# Patient Record
Sex: Male | Born: 1970 | Hispanic: No | Marital: Married | Smoking: Never smoker
Health system: Southern US, Community
[De-identification: ages and names within clinical notes are randomized; demographics above are authoritative.]

## PROBLEM LIST (undated history)

## (undated) DIAGNOSIS — H903 Sensorineural hearing loss, bilateral: Secondary | ICD-10-CM

## (undated) DIAGNOSIS — G4730 Sleep apnea, unspecified: Secondary | ICD-10-CM

## (undated) DIAGNOSIS — E119 Type 2 diabetes mellitus without complications: Secondary | ICD-10-CM

## (undated) DIAGNOSIS — A048 Other specified bacterial intestinal infections: Secondary | ICD-10-CM

## (undated) DIAGNOSIS — E785 Hyperlipidemia, unspecified: Secondary | ICD-10-CM

## (undated) DIAGNOSIS — K760 Fatty (change of) liver, not elsewhere classified: Secondary | ICD-10-CM

## (undated) DIAGNOSIS — I10 Essential (primary) hypertension: Secondary | ICD-10-CM

## (undated) DIAGNOSIS — D126 Benign neoplasm of colon, unspecified: Secondary | ICD-10-CM

## (undated) HISTORY — DX: Sensorineural hearing loss, bilateral: H90.3

## (undated) HISTORY — DX: Type 2 diabetes mellitus without complications: E11.9

## (undated) HISTORY — DX: Sleep apnea, unspecified: G47.30

## (undated) HISTORY — DX: Essential (primary) hypertension: I10

## (undated) HISTORY — DX: Hyperlipidemia, unspecified: E78.5

## (undated) HISTORY — DX: Benign neoplasm of colon, unspecified: D12.6

## (undated) HISTORY — PX: FOOT SURGERY: SHX648

## (undated) HISTORY — DX: Fatty (change of) liver, not elsewhere classified: K76.0

## (undated) HISTORY — DX: Other specified bacterial intestinal infections: A04.8

---

## 1999-04-26 ENCOUNTER — Encounter: Payer: Self-pay | Admitting: Emergency Medicine

## 1999-04-26 ENCOUNTER — Emergency Department (HOSPITAL_COMMUNITY): Admission: EM | Admit: 1999-04-26 | Discharge: 1999-04-26 | Payer: Self-pay | Admitting: Emergency Medicine

## 1999-05-01 ENCOUNTER — Encounter: Payer: Self-pay | Admitting: Orthopedic Surgery

## 1999-05-01 ENCOUNTER — Observation Stay (HOSPITAL_COMMUNITY): Admission: RE | Admit: 1999-05-01 | Discharge: 1999-05-02 | Payer: Self-pay | Admitting: Orthopedic Surgery

## 2012-08-31 ENCOUNTER — Ambulatory Visit (INDEPENDENT_AMBULATORY_CARE_PROVIDER_SITE_OTHER): Payer: No Typology Code available for payment source | Admitting: Internal Medicine

## 2012-08-31 ENCOUNTER — Encounter: Payer: Self-pay | Admitting: Internal Medicine

## 2012-08-31 VITALS — BP 120/80 | HR 79 | Temp 98.5°F | Resp 20 | Ht 67.0 in | Wt 243.0 lb

## 2012-08-31 DIAGNOSIS — Z0000 Encounter for general adult medical examination without abnormal findings: Secondary | ICD-10-CM

## 2012-08-31 DIAGNOSIS — G4733 Obstructive sleep apnea (adult) (pediatric): Secondary | ICD-10-CM

## 2012-08-31 LAB — COMPREHENSIVE METABOLIC PANEL
ALT: 64 U/L — ABNORMAL HIGH (ref 0–53)
AST: 44 U/L — ABNORMAL HIGH (ref 0–37)
Albumin: 4.2 g/dL (ref 3.5–5.2)
Alkaline Phosphatase: 109 U/L (ref 39–117)
BUN: 8 mg/dL (ref 6–23)
CO2: 26 mEq/L (ref 19–32)
Calcium: 9.8 mg/dL (ref 8.4–10.5)
Chloride: 102 mEq/L (ref 96–112)
Creatinine, Ser: 1.2 mg/dL (ref 0.4–1.5)
GFR: 71.22 mL/min (ref >60.00)
Glucose, Bld: 105 mg/dL — ABNORMAL HIGH (ref 70–99)
Potassium: 3.8 mEq/L (ref 3.5–5.1)
Sodium: 139 mEq/L (ref 135–145)
Total Bilirubin: 0.8 mg/dL (ref 0.3–1.2)
Total Protein: 8.5 g/dL — ABNORMAL HIGH (ref 6.0–8.3)

## 2012-08-31 LAB — LIPID PANEL
Cholesterol: 222 mg/dL — ABNORMAL HIGH (ref 0–200)
HDL: 49.1 mg/dL (ref >39.00)
Total CHOL/HDL Ratio: 5
Triglycerides: 162 mg/dL — ABNORMAL HIGH (ref 0.0–149.0)
VLDL: 32.4 mg/dL (ref 0.0–40.0)

## 2012-08-31 NOTE — Progress Notes (Signed)
Subjective:    Patient ID: Eric Adams, male    DOB: Nov 26, 1970, 42 y.o.   MRN: [id number]  HPI  42 year old patient who is seen today to establish with our practice.  His only concern today is loud snoring. He is engaged to be married. He works as a Naval architect but has had no difficulties with daytime sleepiness. No history of accidents.  Patient states his weight was under 45 pounds when he arrived 20 years ago  Past medical history is unremarkable. He did require treatment for a fracture to the right ankle in 2001. Social history. The patient has been a [place] resident for approximately 20 years. He graduated [employer];  family lives in [place];  nonsmoker. Divorced. 35 year old daughter. Engaged to be married. Family history father died at age 93 complications of COPD Mother is 37 in excellent health except for knee osteoarthritis. 2 brothers one deceased unclear causes. 2 sisters are well.      Review of Systems  Constitutional: Negative for fever, chills, activity change, appetite change and fatigue.  HENT: Negative for hearing loss, ear pain, congestion, rhinorrhea, sneezing, mouth sores, trouble swallowing, neck pain, neck stiffness, dental problem, voice change, sinus pressure and tinnitus.   Eyes: Negative for photophobia, pain, redness and visual disturbance.  Respiratory: Negative for apnea, cough, choking, chest tightness, shortness of breath and wheezing.   Cardiovascular: Negative for chest pain, palpitations and leg swelling.  Gastrointestinal: Negative for nausea, vomiting, abdominal pain, diarrhea, constipation, blood in stool, abdominal distention, anal bleeding and rectal pain.  Genitourinary: Negative for dysuria, urgency, frequency, hematuria, flank pain, decreased urine volume, discharge, penile swelling, scrotal swelling, difficulty urinating, genital sores and testicular pain.  Musculoskeletal: Negative for myalgias, back pain, joint swelling, arthralgias and gait  problem.  Skin: Negative for color change, rash and wound.  Neurological: Negative for dizziness, tremors, seizures, syncope, facial asymmetry, speech difficulty, weakness, light-headedness, numbness and headaches.  Hematological: Negative for adenopathy. Does not bruise/bleed easily.  Psychiatric/Behavioral: Negative for suicidal ideas, hallucinations, behavioral problems, confusion, sleep disturbance (Loud snoring), self-injury, dysphoric mood, decreased concentration and agitation. The patient is not nervous/anxious.        Objective:   Physical Exam  Constitutional: He appears well-developed and well-nourished.  Blood pressure 120/80 Weight 243  HENT:  Head: Normocephalic and atraumatic.  Right Ear: External ear normal.  Left Ear: External ear normal.  Nose: Nose normal.  Mouth/Throat: Oropharynx is clear and moist.  Low hanging soft palate with pharyngeal crowding  Eyes: Conjunctivae and EOM are normal. Pupils are equal, round, and reactive to light. No scleral icterus.  Significant pharyngeal crowding  Neck: Normal range of motion. Neck supple. No JVD present. No thyromegaly present.  Cardiovascular: Regular rhythm, normal heart sounds and intact distal pulses.  Exam reveals no gallop and no friction rub.   No murmur heard. Pulmonary/Chest: Effort normal and breath sounds normal. He exhibits no tenderness.  Abdominal: Soft. Bowel sounds are normal. He exhibits no distension and no mass. There is no tenderness.  Genitourinary: Prostate normal and penis normal.  Musculoskeletal: Normal range of motion. He exhibits no edema and no tenderness.  Lymphadenopathy:    He has no cervical adenopathy.  Neurological: He is alert. He has normal reflexes. No cranial nerve deficit. Coordination normal.  Skin: Skin is warm and dry. No rash noted.  Psychiatric: He has a normal mood and affect. His behavior is normal.          Assessment & Plan:   Preventive  health Exogenous  obesity Loud snoring. Rule out OSA. We'll discuss a home sleep study  Exercise weight loss encouraged

## 2012-08-31 NOTE — Patient Instructions (Signed)
It is important that you exercise regularly, at least 20 minutes 3 to 4 times per week.  If you develop chest pain or shortness of breath seek  medical attention.  You need to lose weight.  Consider a lower calorie diet and regular exercise. 

## 2012-09-01 LAB — LDL CHOLESTEROL, DIRECT: Direct LDL: 150.4 mg/dL

## 2012-09-01 LAB — TSH: TSH: 1.88 u[IU]/mL (ref 0.35–5.50)

## 2012-10-30 ENCOUNTER — Ambulatory Visit: Payer: No Typology Code available for payment source | Admitting: Internal Medicine

## 2012-11-23 ENCOUNTER — Encounter: Payer: Self-pay | Admitting: Internal Medicine

## 2012-11-30 ENCOUNTER — Encounter: Payer: Self-pay | Admitting: Internal Medicine

## 2012-11-30 ENCOUNTER — Ambulatory Visit (INDEPENDENT_AMBULATORY_CARE_PROVIDER_SITE_OTHER): Payer: No Typology Code available for payment source | Admitting: Internal Medicine

## 2012-11-30 VITALS — BP 148/96 | HR 84 | Ht 67.0 in | Wt 243.6 lb

## 2012-11-30 DIAGNOSIS — R1012 Left upper quadrant pain: Secondary | ICD-10-CM

## 2012-11-30 MED ORDER — PANTOPRAZOLE SODIUM 40 MG PO TBEC: 40.0000 mg | DELAYED_RELEASE_TABLET | Freq: Every day | ORAL | Status: DC

## 2012-11-30 NOTE — Progress Notes (Signed)
Patient ID: Eric Adams, male   DOB: 1970/04/04, 42 y.o.   MRN: [id number] HPI: [name] is a 42 yo male with little PMH who is seen in consultation at the request of Dr. Amador Cunas to evaluate left upper quadrant abdominal pain.  The patient is here today with his wife.  The patient reports approximately 1 year of left upper quadrant burning discomfort. The patient reports that this seems to come and go, it is not constant in nature, and seems to relate to certain types of foods. He is sure that spicy food such as very hot peppers make it worse. Spicy peppers also cause loose stools, but his loose stools do not always relate to the burning discomfort. He denies nausea or vomiting. He denies heartburn, dysphagia or odynophagia. No weight loss. Good appetite. He notes that working out in the gym seems to lessen the burning discomfort. He denies change in bowel habits other than a rare loose stools which he associates with hot peppers. He denies melena or rectal bleeding. He normally has one to 2 formed stools daily. He denies trauma, skin rash. No fevers or chills. He is originally from [place].  No known history of H. pylori, previous exploratory treatment or family history of H. pylori. He denies NSAID use.    Of note the pain can occur or be noticed in the midaxillary line  History reviewed. No pertinent past medical history.  Past Surgical History  Procedure Laterality Date  . Foot surgery      right    Current Outpatient Prescriptions  Medication Sig Dispense Refill  . acetaminophen (TYLENOL) 325 MG tablet Take 650 mg by mouth every 6 (six) hours as needed for pain.       No current facility-administered medications for this visit.   No Known Allergies  History reviewed. No pertinent family history.  History  Substance Use Topics  . Smoking status: Never Smoker   . Smokeless tobacco: Never Used  . Alcohol Use: Yes     Comment: glass of wine occassionally    ROS: As per history  of present illness, otherwise negative  BP 148/96  Pulse 84  Ht 5\' 7"  (1.702 m)  Wt 243 lb 9.6 oz (110.496 kg)  BMI 38.14 kg/m2 Constitutional: Well-developed and well-nourished. No distress. HEENT: Normocephalic and atraumatic. Oropharynx is clear and moist. No oropharyngeal exudate. Conjunctivae are normal.  No scleral icterus. Neck: Neck supple. Trachea midline. Cardiovascular: Normal rate, regular rhythm and intact distal pulses. No M/R/G.   Pulmonary/chest: Effort normal and breath sounds normal. No wheezing, rales or rhonchi. No chest wall pain with firm pressure over the rib cage. Abdominal: Soft, nontender, nondistended. Bowel sounds active throughout. There are no masses palpable. No hepatosplenomegaly. Extremities: no clubbing, cyanosis, or edema Lymphadenopathy: No cervical adenopathy noted. Neurological: Alert and oriented to person place and time. Skin: Skin is warm and dry. No rashes noted. Psychiatric: Normal mood and affect. Behavior is normal.  RELEVANT LABS AND IMAGING:  CMP     Component Value Date/Time   NA 139 08/31/2012 1410   K 3.8 08/31/2012 1410   CL 102 08/31/2012 1410   CO2 26 08/31/2012 1410   GLUCOSE 105* 08/31/2012 1410   BUN 8 08/31/2012 1410   CREATININE 1.2 08/31/2012 1410   CALCIUM 9.8 08/31/2012 1410   PROT 8.5* 08/31/2012 1410   ALBUMIN 4.2 08/31/2012 1410   AST 44* 08/31/2012 1410   ALT 64* 08/31/2012 1410   ALKPHOS 109 08/31/2012 1410  BILITOT 0.8 08/31/2012 1410    ASSESSMENT/PLAN:  42 yo male with little PMH who is seen in consultation at the request of Dr. Amador Cunas to evaluate left upper quadrant abdominal pain  1.  LUQ/left mid-axillary line pain -- gastroduodenitis with without H. pylori could explain left upper quadrant pain, but I cannot exclude musculoskeletal or chest wall pain at this time. He would be at high risk for H. pylori given his ethnicity and the higher prevalence of H. pylori infection in African nations.  I have recommended an H. pylori  breath test to exclude active infection. If it is positive he will need antibiotic therapy. Also will give him a trial pantoprazole 40 mg once daily. He is advised to take this 30 minutes to one hour before his first meal of the day. He is instructed to not begin PPI therapy until after breath testing. I would like to see him back in 4-6 weeks' time to assess response. If there is no response, we will consider abdominal ultrasound or EGD. We also could give a trial of muscle relaxers to see if this is musculoskeletal in nature.  He is advised to call us should the pain worsen in any way. He voices understanding.

## 2012-11-30 NOTE — Patient Instructions (Addendum)
You have been scheduled for an H. Pylori/Urea Breath Test. Your appointment is on 12/08/2012 at 7:45am. Please go to [facility] and check in at out patient registration. Please plan on being here for around 20-30 minutes.  Please follow the instructions below to prepare for your test:  1. 12/08/2012 (one day prior to your test) avoid high gas foods, high fiber goods, fruits and fruit juices, vegetables, beans, cereals, fiber supplements, onion and bell peppers. You may eat hamburger, chicken, beef, tuna fish and eggs.  2. You should have nothing to eat or drink after 9:00 pm on 12/10/2012.   3. Do not smoke, chew gum or eat hard candy the morning of your test.  4. You may not have the test within 2 weeks after taking any antibiotics or within 4 weeks if confirming eradication of H.Pylori.  5. On today (2 weeks prior to your test), stop any Pepto-Bismol and/or proton pump inhibitors (Prevacid, Zegerid, Nexium, Prilosec, Protonix, Aciphex, Dexilant).  6. You may use over the counter strength Axid, Pepcid, Tagamet and Zantac 2 weeks prior to your tests. However, you should STOP these 24 hours before the test. You may use Tums and Maalox.  After your test Dr. Rhea Belton would like you to start taking pantoprazole 40 mg                                               We are excited to introduce MyChart, a new best-in-class service that provides you online access to important information in your electronic medical record. We want to make it easier for you to view your health information - all in one secure location - when and where you need it. We expect MyChart will enhance the quality of care and service we provide.  When you register for MyChart, you can:    View your test results.    Request appointments and receive appointment reminders via email.    Request medication renewals.    View your medical history, allergies, medications and immunizations.    Communicate with your  physician's office through a password-protected site.    Conveniently print information such as your medication lists.  To find out if MyChart is right for you, please talk to a member of our clinical staff today. We will gladly answer your questions about this free health and wellness tool.  If you are age 42 or older and want a member of your family to have access to your record, you must provide written consent by completing a proxy form available at our office. Please speak to our clinical staff about guidelines regarding accounts for patients younger than age 11.  As you activate your MyChart account and need any technical assistance, please call the MyChart technical support line at [phone]) [phone]-CHART [phone]) or email your question to [email or web address]. If you email your question(s), please include your name, a return phone number and the best time to reach you.  If you have non-urgent health-related questions, you can send a message to our office through MyChart at [email or web address]. If you have a medical emergency, call 911.  Thank you for using MyChart as your new health and wellness resource!   MyChart licensed from [employer],  [id number]. Patents Pending.

## 2012-12-21 ENCOUNTER — Encounter (HOSPITAL_COMMUNITY)
Admission: RE | Disposition: A | Discharge status: Home / Self Care | Payer: Self-pay | Source: Ambulatory Visit | Attending: Internal Medicine

## 2012-12-21 ENCOUNTER — Ambulatory Visit (HOSPITAL_COMMUNITY)
Admission: RE | Admit: 2012-12-21 | Discharge: 2012-12-21 | Disposition: A | Discharge status: Home / Self Care | Payer: No Typology Code available for payment source | Source: Ambulatory Visit | Attending: Internal Medicine | Admitting: Internal Medicine

## 2012-12-21 DIAGNOSIS — A048 Other specified bacterial intestinal infections: Secondary | ICD-10-CM | POA: Insufficient documentation

## 2012-12-21 DIAGNOSIS — R1012 Left upper quadrant pain: Secondary | ICD-10-CM | POA: Insufficient documentation

## 2012-12-21 HISTORY — PX: BREATH TEK H PYLORI: SHX5422

## 2012-12-21 SURGERY — BREATH TEST, FOR HELICOBACTER PYLORI

## 2012-12-22 ENCOUNTER — Encounter (HOSPITAL_COMMUNITY): Payer: Self-pay | Admitting: Internal Medicine

## 2013-01-25 ENCOUNTER — Telehealth: Payer: Self-pay | Admitting: Internal Medicine

## 2013-01-25 DIAGNOSIS — R1012 Left upper quadrant pain: Secondary | ICD-10-CM

## 2013-01-25 NOTE — Telephone Encounter (Signed)
Spoke with Ms. Harris and she states the patient is still having LUQ pain. He would like to have the ultrasound or scan that Dr. Rhea Belton mentioned at OV. Please, advise.

## 2013-01-25 NOTE — Telephone Encounter (Signed)
ABD [place] complete, LUQ pain If negative, then EGD

## 2013-01-26 ENCOUNTER — Other Ambulatory Visit: Payer: Self-pay | Admitting: *Deleted

## 2013-01-26 DIAGNOSIS — R1012 Left upper quadrant pain: Secondary | ICD-10-CM

## 2013-01-26 NOTE — Telephone Encounter (Signed)
Spoke with patient's EC  Eric Adams and she would like to speak with Dr. Rhea Belton. She is asking to discuss possible CT for patient.

## 2013-01-26 NOTE — Telephone Encounter (Signed)
Mr. Allegretto significant other called because she would like a CT scan of the abdomen and pelvis performed rather than ultrasound She is worried that he may have a neuroendocrine tumor.  She admits that she works in a [employer] and hears a lot about NET The patient has had some response to PPI, but she also reports that he has frequent hot and also continues to have left upper quadrant and left back pain I did tell her that abdominal ultrasound often gives good information, but often does not completely visualize the pancreas I also explained that CT involves radiation, where ultrasound is not, and is more expensive With this being understood, we will proceed to CT scan of the abdomen and pelvis with contrast.  Abdominal ultrasound should be canceled

## 2013-01-26 NOTE — Telephone Encounter (Signed)
Scheduled [place] abd at [facility] radiology([place]) on 01/29/13 at 8:30 AM. NPO 6 hours prior. Left a message for Adalynn Harris to call me back.

## 2013-01-26 NOTE — Telephone Encounter (Signed)
Left a message for patient's EC to call me.

## 2013-01-27 ENCOUNTER — Telehealth: Payer: Self-pay | Admitting: Internal Medicine

## 2013-01-27 DIAGNOSIS — R1012 Left upper quadrant pain: Secondary | ICD-10-CM

## 2013-01-27 NOTE — Telephone Encounter (Signed)
Had [place] scheduled, then fiancee wanted a CT so it was scheduled. Steffanie Rainwater came into ofc, got pt on the phone and d/t radiation, he wants the u/s which is scheduled for 02/01/13 at 0900. Pt stated understanding over the phone.

## 2013-01-27 NOTE — Telephone Encounter (Signed)
Informed pt of his CT appt on 02/01/13 at 0900. He stated understanding and will pick up contrast and instructions.

## 2013-01-28 ENCOUNTER — Other Ambulatory Visit: Payer: No Typology Code available for payment source

## 2013-01-28 ENCOUNTER — Other Ambulatory Visit (HOSPITAL_COMMUNITY): Payer: No Typology Code available for payment source

## 2013-01-29 ENCOUNTER — Ambulatory Visit (HOSPITAL_COMMUNITY): Payer: No Typology Code available for payment source

## 2013-02-01 ENCOUNTER — Ambulatory Visit (HOSPITAL_COMMUNITY)
Admission: RE | Admit: 2013-02-01 | Discharge: 2013-02-01 | Disposition: A | Discharge status: Home / Self Care | Payer: No Typology Code available for payment source | Source: Ambulatory Visit | Attending: Internal Medicine | Admitting: Internal Medicine

## 2013-02-01 ENCOUNTER — Other Ambulatory Visit: Payer: No Typology Code available for payment source

## 2013-02-01 DIAGNOSIS — R109 Unspecified abdominal pain: Secondary | ICD-10-CM | POA: Insufficient documentation

## 2013-02-01 DIAGNOSIS — R1012 Left upper quadrant pain: Secondary | ICD-10-CM

## 2013-02-26 ENCOUNTER — Encounter: Payer: Self-pay | Admitting: Internal Medicine

## 2013-03-02 ENCOUNTER — Encounter: Payer: Self-pay | Admitting: Internal Medicine

## 2013-03-02 ENCOUNTER — Ambulatory Visit (INDEPENDENT_AMBULATORY_CARE_PROVIDER_SITE_OTHER): Payer: BC Managed Care – PPO | Admitting: Internal Medicine

## 2013-03-02 VITALS — BP 130/90 | HR 80 | Ht 67.0 in | Wt 237.0 lb

## 2013-03-02 DIAGNOSIS — K760 Fatty (change of) liver, not elsewhere classified: Secondary | ICD-10-CM

## 2013-03-02 DIAGNOSIS — R748 Abnormal levels of other serum enzymes: Secondary | ICD-10-CM

## 2013-03-02 DIAGNOSIS — R1012 Left upper quadrant pain: Secondary | ICD-10-CM

## 2013-03-02 DIAGNOSIS — A048 Other specified bacterial intestinal infections: Secondary | ICD-10-CM

## 2013-03-02 DIAGNOSIS — K7689 Other specified diseases of liver: Secondary | ICD-10-CM

## 2013-03-02 MED ORDER — BIS SUBCIT-METRONID-TETRACYC 140-125-125 MG PO CAPS: 3.0000 | ORAL_CAPSULE | Freq: Three times a day (TID) | ORAL | Status: DC

## 2013-03-02 MED ORDER — PANTOPRAZOLE SODIUM 40 MG PO TBEC: 40.0000 mg | DELAYED_RELEASE_TABLET | Freq: Every day | ORAL | Status: DC

## 2013-03-02 NOTE — Patient Instructions (Signed)
Your physician has requested that you go to the basement for  lab work before leaving today.  We have sent the following medications to your pharmacy for you to pick up at your convenience: Pylera, while taking pylera take protonix twice a day. Then when finished go back to taking protonix once daily.  Follow up with Dr. Hilarie Fredrickson in office in 3 weeks                                               We are excited to introduce MyChart, a new best-in-class service that provides you online access to important information in your electronic medical record. We want to make it easier for you to view your health information - all in one secure location - when and where you need it. We expect MyChart will enhance the quality of care and service we provide.  When you register for MyChart, you can:    View your test results.    Request appointments and receive appointment reminders via email.    Request medication renewals.    View your medical history, allergies, medications and immunizations.    Communicate with your physician's office through a password-protected site.    Conveniently print information such as your medication lists.  To find out if MyChart is right for you, please talk to a member of our clinical staff today. We will gladly answer your questions about this free health and wellness tool.  If you are age 33 or older and want a member of your family to have access to your record, you must provide written consent by completing a proxy form available at our office. Please speak to our clinical staff about guidelines regarding accounts for patients younger than age 92.  As you activate your MyChart account and need any technical assistance, please call the MyChart technical support line at (336) 83-CHART [phone]) or email your question to [email or web address]. If you email your question(s), please include your name, a return phone number and the best time to reach you.  If you  have non-urgent health-related questions, you can send a message to our office through [place] [facility] [email or web address]. If you have a medical emergency, call 911.  Thank you for using MyChart as your new health and wellness resource!   MyChart licensed from [employer],  1999-2010. Patents Pending.

## 2013-03-02 NOTE — Progress Notes (Signed)
Subjective:    Patient ID: Eric Adams, male    DOB: February 18, 1971, 43 y.o.   MRN: [id number]  HPI Eric Adams is a 43 yo male with little past no history who is seen in followup. He was previously seen around October 2014 to evaluate upper and left upper abdominal pain. He is here alone today. He reports that he is taking pantoprazole 40 mg daily he continues to have upper and left upper quadrant abdominal pain. At times it feels like it is in the mid axillary line. No nausea or vomiting. Food, particularly spicy foods tends to make his pain worse. No heartburn. No dysphagia or odynophagia. Appetite has remained good. He has lost 5-6 pounds since his last visit here. He was unaware of this weight loss, but does note lately his wife has changed his diet. He was married in December and has been eating lower-calorie foods. No change in bowel movements including no melena or rectal bleeding.  He did have a H. pylori breath test performed in October. This result was positive, but unfortunately we never received the result and thus he has not been treated to this point.  Review of Systems As per history of present illness, otherwise negative  Current Medications, Allergies, Past Medical History, Past Surgical History, Family History and Social History were reviewed in [employer] record.     Objective:   Physical Exam BP 130/90  Pulse 80  Ht 5\' 7"  (1.702 m)  Wt 237 lb (107.502 kg)  BMI 37.11 kg/m2 Constitutional: Well-developed and well-nourished. No distress. HEENT: Normocephalic and atraumatic. Oropharynx is clear and moist. No oropharyngeal exudate. Conjunctivae are normal.  No scleral icterus. Cardiovascular: Normal rate, regular rhythm and intact distal pulses. No M/R/G Pulmonary/chest: Effort normal and breath sounds normal. No wheezing, rales or rhonchi. Abdominal: Soft, nontender, nondistended. Bowel sounds active throughout.  Extremities: no clubbing, cyanosis,  or edema Neurological: Alert and oriented to person place and time. Skin: Skin is warm and dry. No rashes noted. Psychiatric: Normal mood and affect. Behavior is normal.  ULTRASOUND ABDOMEN COMPLETE -- Dec 2014   COMPARISON:  None.   FINDINGS: Gallbladder:   No gallstones or wall thickening visualized. There is no pericholecystic fluid. No sonographic Murphy sign noted.   Common bile duct:   Diameter: 5 mm. There is no intrahepatic, common hepatic, or common bile duct dilatation.   Liver:   There is diffuse increased echogenicity throughout the liver.   IVC:   No abnormality visualized.   Pancreas:   Visualized portion unremarkable. Most of pancreas is obscured by gas.   Spleen:   Size and appearance within normal limits.   Right Kidney:   Length: 10.6 cm. Echogenicity within normal limits. No mass or hydronephrosis visualized.   Left Kidney:   Length: 10.9 cm. Echogenicity within normal limits. No mass or hydronephrosis visualized.   Abdominal aorta:   No aneurysm visualized.   Other findings:   No demonstrable ascites.   IMPRESSION: Liver shows diffuse increased echogenicity, probably indicative of diffuse hepatic steatosis. While no focal liver lesions are identified, it must be cautioned that the sensitivity of ultrasound for focal liver lesions is diminished significantly given this degree of underlying fatty change.   Most of pancreas obscured by gas.   Study otherwise unremarkable.  CMP     Component Value Date/Time   NA 139 08/31/2012 1410   K 3.8 08/31/2012 1410   CL 102 08/31/2012 1410   CO2 26 08/31/2012  1410   GLUCOSE 105* 08/31/2012 1410   BUN 8 08/31/2012 1410   CREATININE 1.2 08/31/2012 1410   CALCIUM 9.8 08/31/2012 1410   PROT 8.5* 08/31/2012 1410   ALBUMIN 4.2 08/31/2012 1410   AST 44* 08/31/2012 1410   ALT 64* 08/31/2012 1410   ALKPHOS 109 08/31/2012 1410   BILITOT 0.8 08/31/2012 1410       Assessment & Plan:   43 yo male with little past  no history who is seen in followup. He was previously seen around October 2014 to evaluate upper and left upper abdominal pain.  1.  LUQ pain/+H pylori -- his breath test was positive in October indicating active H. pylori infection. I will treat him with Pylera for 10 days. We will increase his pantoprazole to 40 mg twice daily while he is on this antibiotic. I will see him back in 3 weeks' time, and if his pain is not totally resolved after H. pylori treatment, we will pursue upper endoscopy. He understands and is happy with this plan.  2.  fatty liver/elevated liver enzymes -- he did have slight elevation to AST and ALT in July 2014. Abdominal ultrasound indicated fatty liver disease. I am repeating liver enzymes today and also checking viral hepatitis serologies, ferritin and ANA. If his liver enzymes remain elevated and no other cause is found for elevated liver enzymes, I will start him on vitamin E therapy. I discussed the importance of healthy diet and exercise. He has an elevated BMI, though he also has significant muscle mass. We will discuss nonalcoholic steatohepatitis further at followup if his liver enzymes remain elevated.  He is asked to avoid alcohol  followup in 3 weeks

## 2013-03-05 ENCOUNTER — Telehealth: Payer: Self-pay | Admitting: Internal Medicine

## 2013-03-05 NOTE — Telephone Encounter (Signed)
Spoke for several minutes with wife about pt's OV . We discussed labs, dx, etc. Mailed her a copy of the ofc note; pt will f/u on 03/22/13.

## 2013-03-05 NOTE — Telephone Encounter (Signed)
Discussed with labs labs that [facility] states they will draw.

## 2013-03-17 ENCOUNTER — Other Ambulatory Visit (INDEPENDENT_AMBULATORY_CARE_PROVIDER_SITE_OTHER): Payer: BC Managed Care – PPO

## 2013-03-17 DIAGNOSIS — K760 Fatty (change of) liver, not elsewhere classified: Secondary | ICD-10-CM

## 2013-03-17 DIAGNOSIS — K7689 Other specified diseases of liver: Secondary | ICD-10-CM

## 2013-03-17 DIAGNOSIS — A048 Other specified bacterial intestinal infections: Secondary | ICD-10-CM

## 2013-03-17 DIAGNOSIS — R1012 Left upper quadrant pain: Secondary | ICD-10-CM

## 2013-03-17 LAB — COMPREHENSIVE METABOLIC PANEL
ALBUMIN: 4.4 g/dL (ref 3.5–5.2)
ALT: 42 U/L (ref 0–53)
AST: 31 U/L (ref 0–37)
Alkaline Phosphatase: 109 U/L (ref 39–117)
BUN: 11 mg/dL (ref 6–23)
CALCIUM: 9.9 mg/dL (ref 8.4–10.5)
CO2: 29 mEq/L (ref 19–32)
Chloride: 102 mEq/L (ref 96–112)
Creatinine, Ser: 1.1 mg/dL (ref 0.4–1.5)
GFR: 78.61 mL/min (ref >60.00)
Glucose, Bld: 100 mg/dL — ABNORMAL HIGH (ref 70–99)
POTASSIUM: 3.7 meq/L (ref 3.5–5.1)
SODIUM: 137 meq/L (ref 135–145)
Total Bilirubin: 1 mg/dL (ref 0.3–1.2)
Total Protein: 8.9 g/dL — ABNORMAL HIGH (ref 6.0–8.3)

## 2013-03-17 LAB — FERRITIN: Ferritin: 132.9 ng/mL (ref 22.0–322.0)

## 2013-03-17 LAB — CBC
HCT: 44.7 % (ref 39.0–52.0)
Hemoglobin: 15.4 g/dL (ref 13.0–17.0)
MCHC: 34.4 g/dL (ref 30.0–36.0)
MCV: 81.6 fl (ref 78.0–100.0)
Platelets: 304 10*3/uL (ref 150.0–400.0)
RBC: 5.48 Mil/uL (ref 4.22–5.81)
RDW: 14.5 % (ref 11.5–14.6)
WBC: 5.6 10*3/uL (ref 4.5–10.5)

## 2013-03-18 ENCOUNTER — Encounter: Payer: Self-pay | Admitting: Internal Medicine

## 2013-03-18 LAB — HEPATITIS A ANTIBODY, TOTAL: HEP A TOTAL AB: REACTIVE — AB

## 2013-03-18 LAB — HEPATITIS B SURFACE ANTIBODY,QUALITATIVE: HEP B S AB: NEGATIVE

## 2013-03-18 LAB — HEPATITIS B SURFACE ANTIGEN: HEP B S AG: NEGATIVE

## 2013-03-18 LAB — HEPATITIS C ANTIBODY: HCV Ab: NEGATIVE

## 2013-03-18 LAB — ANA: Anti Nuclear Antibody(ANA): NEGATIVE

## 2013-03-22 ENCOUNTER — Ambulatory Visit (INDEPENDENT_AMBULATORY_CARE_PROVIDER_SITE_OTHER): Payer: BC Managed Care – PPO | Admitting: Internal Medicine

## 2013-03-22 ENCOUNTER — Encounter: Payer: Self-pay | Admitting: Internal Medicine

## 2013-03-22 VITALS — BP 128/88 | HR 84 | Ht 67.0 in | Wt 234.8 lb

## 2013-03-22 DIAGNOSIS — A048 Other specified bacterial intestinal infections: Secondary | ICD-10-CM

## 2013-03-22 DIAGNOSIS — K7689 Other specified diseases of liver: Secondary | ICD-10-CM

## 2013-03-22 DIAGNOSIS — K760 Fatty (change of) liver, not elsewhere classified: Secondary | ICD-10-CM

## 2013-03-22 DIAGNOSIS — Z8619 Personal history of other infectious and parasitic diseases: Secondary | ICD-10-CM

## 2013-03-22 DIAGNOSIS — R1012 Left upper quadrant pain: Secondary | ICD-10-CM

## 2013-03-22 DIAGNOSIS — Z23 Encounter for immunization: Secondary | ICD-10-CM

## 2013-03-22 NOTE — Patient Instructions (Signed)
Your physician has requested that you go to the basement for the following lab work before leaving today:  Hepatic function panel in June 2015     H. Pylori test in February 2015  Follow up with Dr. Hilarie Fredrickson in 1 year  Discontinue all medications

## 2013-03-22 NOTE — Progress Notes (Signed)
Subjective:    Patient ID: Eric Adams, male    DOB: Aug 15, 1970, 43 y.o.   MRN: [id number]  HPI Eric Adams is a 43 yo male with history of elevated LFTs, fatty liver, H. pylori who is seen in followup. He was previously seen 3 weeks ago to evaluate persistent left upper quadrant pain. It was discovered at this appointment that he had a positive H. pylori breath test in October, but the report was never received in my office and therefore he was not treated. He has now completed treatment with Pylera. He reports overall he is feeling much better. Significant improvement in left upper quadrant pain and reports it is only very very mild at present. He feels that this may be due to the fact that he has changed jobs and is being more active. He notices the pain much much less when he is active or exercising. He reports he is eating well with no nausea or vomiting. No heartburn. Normal bowel movements without blood in his stool or melena. No itching, jaundice, lower extremity edema.  He is still taking pantoprazole 40 mg daily  Review of Systems As per history of present illness, otherwise negative  Current Medications, Allergies, Past Medical History, Past Surgical History, Family History and Social History were reviewed in [employer] record.     Objective:   Physical Exam BP 128/88  Pulse 84  Ht 5\' 7"  (1.702 m)  Wt 234 lb 12.8 oz (106.505 kg)  BMI 36.77 kg/m2 Constitutional: Well-developed and well-nourished. No distress. HEENT: Normocephalic and atraumatic.  No scleral icterus. Cardiovascular: Normal rate, regular rhythm and intact distal pulses. No M/R/G Pulmonary/chest: Effort normal and breath sounds normal. No wheezing, rales or rhonchi. Abdominal: Soft, nontender, nondistended. Bowel sounds active throughout. There are no masses palpable. No hepatosplenomegaly. Extremities: no clubbing, cyanosis, or edema Neurological: Alert and oriented to person place and  time. Psychiatric: Normal mood and affect. Behavior is normal.  CMP     Component Value Date/Time   NA 137 03/17/2013 1202   K 3.7 03/17/2013 1202   CL 102 03/17/2013 1202   CO2 29 03/17/2013 1202   GLUCOSE 100* 03/17/2013 1202   BUN 11 03/17/2013 1202   CREATININE 1.1 03/17/2013 1202   CALCIUM 9.9 03/17/2013 1202   PROT 8.9* 03/17/2013 1202   ALBUMIN 4.4 03/17/2013 1202   AST 31 03/17/2013 1202   ALT 42 03/17/2013 1202   ALKPHOS 109 03/17/2013 1202   BILITOT 1.0 03/17/2013 1202   Hep B surf Ag/Ab neg Hep C ab neg ANA neg TSH normal  CBC    Component Value Date/Time   WBC 5.6 03/17/2013 1202   RBC 5.48 03/17/2013 1202   HGB 15.4 03/17/2013 1202   HCT 44.7 03/17/2013 1202   PLT 304.0 03/17/2013 1202   MCV 81.6 03/17/2013 1202   MCHC 34.4 03/17/2013 1202   RDW 14.5 03/17/2013 1202   Abd [place] reviewed -- fatty infiltration of the liver    Assessment & Plan:   43 yo male with history of elevated LFTs, fatty liver, H. pylori who is seen in followup.   1.  H pylori -- now status post treatment with Pylera x10 days. I recommended H. pylori stool antigen to be performed in several weeks off of his PPI. This will be done to confirm eradication. He will also stop his pantoprazole at present given the lack of significant heartburn or dyspepsia.  2.  Left upper quadrant pain -- significant  improvement. This may be due to eradicated H. pylori and improved inflammation. However, we discussed that it could be musculoskeletal related. Either way, it has improved, and I asked that he notify me should the pain worsen or return in any way. He voices understanding  3.  Elevated liver enzymes/fatty liver -- ultrasound revealed fatty liver. Extensive lab testing did not show viral, autoimmune, or genetic causes for liver inflammation.  On most recent check this month, his liver enzymes had normalized. This is encouraging. We discussed trying to maintain a healthy weight, daily exercise, and a healthy diet. We will  follow his liver enzymes every 6 months for the next several years. He is immune to hepatitis A, but not immune to hepatitis B and I have recommended that he begin the hepatitis B vaccine series. He will start this today --Repeat hepatic function panel in 6 months, followup in one year, sooner if necessary

## 2013-04-05 ENCOUNTER — Telehealth: Payer: Self-pay | Admitting: *Deleted

## 2013-04-05 NOTE — Telephone Encounter (Signed)
Message copied by Lance Morin on Mon Apr 05, 2013 11:08 AM ------      Message from: Jerene Bears      Created: Fri Mar 19, 2013  5:53 PM       Liver enzymes have normalized, which is good news.       I do think we should keep a close eye on them going forward for at least the next year, then annually      Remains labs normal and no evidence of autoimmune liver disease or iron overload      He is immune to Hep A       And needs to be vaccinated against Hep B.        Repeat Hepatic function panel in 3 months ------

## 2013-04-05 NOTE — Telephone Encounter (Signed)
Informed pt of results and he has had his 1st Hep B injection on 03/22/13 so his 2nd will be due around 04/21/13. Will mail him a note informing him and we will call in in 3 months to repeat labs. Pt stated understanding.

## 2013-04-26 ENCOUNTER — Telehealth: Payer: Self-pay | Admitting: Internal Medicine

## 2013-04-26 NOTE — Telephone Encounter (Signed)
Pt needs sleep a study in order to get new cpap machine. His isn't working. pls advise

## 2013-04-26 NOTE — Telephone Encounter (Signed)
Left message on voicemail to call office.  

## 2013-04-27 ENCOUNTER — Telehealth: Payer: Self-pay | Admitting: Internal Medicine

## 2013-04-27 NOTE — Telephone Encounter (Signed)
That is fine 

## 2013-04-27 NOTE — Telephone Encounter (Signed)
Pt needs the last appt of the day due to work, is it ok to use 3/23 @ 4:15  (it a Same Day)

## 2013-04-27 NOTE — Telephone Encounter (Signed)
I have scheduled injection #2 with his wife for 04/29/13 at 4:00.  He is due for lab work the end of April.

## 2013-04-28 ENCOUNTER — Ambulatory Visit (INDEPENDENT_AMBULATORY_CARE_PROVIDER_SITE_OTHER): Payer: BC Managed Care – PPO | Admitting: Internal Medicine

## 2013-04-28 ENCOUNTER — Encounter: Payer: Self-pay | Admitting: Internal Medicine

## 2013-04-28 DIAGNOSIS — K760 Fatty (change of) liver, not elsewhere classified: Secondary | ICD-10-CM

## 2013-04-28 DIAGNOSIS — K7689 Other specified diseases of liver: Secondary | ICD-10-CM

## 2013-04-28 DIAGNOSIS — Z23 Encounter for immunization: Secondary | ICD-10-CM

## 2013-04-28 NOTE — Progress Notes (Signed)
Patient came today for his 2nd Hepatitis B vaccine. Upon attempting to document Hepatitis B injection, it was discovered that the original order placed should have been entered as a pediatric dose, not adult dose. Therefore, a new pediatric dose order was entered showing the correct NDC # of the vials used for injection. Patient's injection #1 was given 03/22/13, 2nd injection was given today (04/28/13) and 3rd injection should be given around 09/19/13. Genella Mech, CMA gave injection today and I assisted with charting.

## 2013-05-17 ENCOUNTER — Ambulatory Visit: Payer: BC Managed Care – PPO | Admitting: Internal Medicine

## 2013-05-25 ENCOUNTER — Encounter: Payer: Self-pay | Admitting: Internal Medicine

## 2013-05-25 ENCOUNTER — Ambulatory Visit (INDEPENDENT_AMBULATORY_CARE_PROVIDER_SITE_OTHER): Payer: BC Managed Care – PPO | Admitting: Internal Medicine

## 2013-05-25 VITALS — BP 136/80 | HR 86 | Temp 98.4°F | Resp 20 | Ht 67.0 in | Wt 246.0 lb

## 2013-05-25 DIAGNOSIS — G4733 Obstructive sleep apnea (adult) (pediatric): Secondary | ICD-10-CM

## 2013-05-25 NOTE — Patient Instructions (Signed)
Home sleep study as discussed  You need to lose weight.  Consider a lower calorie diet and regular exercise.

## 2013-05-25 NOTE — Progress Notes (Signed)
Pre-visit discussion using our clinic review tool. No additional management support is needed unless otherwise documented below in the visit note.  

## 2013-05-25 NOTE — Progress Notes (Signed)
Subjective:    Patient ID: Eric Adams, male    DOB: 1971/02/01, 43 y.o.   MRN: [id number]  HPI  43 year old patient who is seen today in followup.  He and his wife have concerns about possible OSA.  He was seen for his initial exam in July of last year and a home sleep study was ordered, but not performed. Wife does describe very loud snoring  Past Medical History  Diagnosis Date  . Hepatic steatosis   . H. pylori infection     History   Social History  . Marital Status: Single    Spouse Name: N/A    Number of Children: 1  . Years of Education: N/A   Occupational History  . Truck Geophysicist/field seismologist    Social History Main Topics  . Smoking status: Never Smoker   . Smokeless tobacco: Never Used  . Alcohol Use: Yes     Comment: glass of wine occasionally  . Drug Use: No  . Sexual Activity: Not on file   Other Topics Concern  . Not on file   Social History Narrative  . No narrative on file    Past Surgical History  Procedure Laterality Date  . Foot surgery Right   . Breath tek h pylori N/A 12/21/2012    Procedure: BREATH TEK H PYLORI;  Surgeon: Jerene Bears, MD;  Location: [facility];  Service: Gastroenterology;  Laterality: N/A;    Family History  Problem Relation Age of Onset  . Colon cancer Neg Hx     No Known Allergies  No current outpatient prescriptions on file prior to visit.   No current facility-administered medications on file prior to visit.    BP 136/80  Pulse 86  Temp(Src) 98.4 F (36.9 C) (Oral)  Resp 20  Ht 5\' 7"  (1.702 m)  Wt 246 lb (111.585 kg)  BMI 38.52 kg/m2  SpO2 95%       Review of Systems  Constitutional: Negative for fever, chills, appetite change and fatigue.  HENT: Negative for congestion, dental problem, ear pain, hearing loss, sore throat, tinnitus, trouble swallowing and voice change.   Eyes: Negative for pain, discharge and visual disturbance.  Respiratory: Negative for cough, chest tightness, wheezing and stridor.     Cardiovascular: Negative for chest pain, palpitations and leg swelling.  Gastrointestinal: Negative for nausea, vomiting, abdominal pain, diarrhea, constipation, blood in stool and abdominal distention.  Genitourinary: Negative for urgency, hematuria, flank pain, discharge, difficulty urinating and genital sores.  Musculoskeletal: Negative for arthralgias, back pain, gait problem, joint swelling, myalgias and neck stiffness.  Skin: Negative for rash.  Neurological: Negative for dizziness, syncope, speech difficulty, weakness, numbness and headaches.  Hematological: Negative for adenopathy. Does not bruise/bleed easily.  Psychiatric/Behavioral: Negative for behavioral problems and dysphoric mood. The patient is not nervous/anxious.        Objective:   Physical Exam  Constitutional: He is oriented to person, place, and time. He appears well-developed.  Blood pressure 130/80 Obese  HENT:  Head: Normocephalic.  Right Ear: External ear normal.  Left Ear: External ear normal.   Low hanging soft palate with extreme pharyngeal crowding  Eyes: Conjunctivae and EOM are normal.  Neck: Normal range of motion.  Cardiovascular: Normal rate and normal heart sounds.   Pulmonary/Chest: Breath sounds normal.  Abdominal: Bowel sounds are normal.  Musculoskeletal: Normal range of motion. He exhibits no edema and no tenderness.  Neurological: He is alert and oriented to person, place, and time.  Psychiatric:  He has a normal mood and affect. His behavior is normal.          Assessment & Plan:   OSA, suspect.  We'll reschedule a home sleep study Weight loss encouraged

## 2013-08-05 ENCOUNTER — Telehealth: Payer: Self-pay | Admitting: Internal Medicine

## 2013-08-05 NOTE — Telephone Encounter (Signed)
Pt is still waiting on the referral for the sleep study since march, sytem shows information sent on 05/26/13, however pt has not heard anything.

## 2013-08-06 NOTE — Telephone Encounter (Signed)
Sent to [facility] to schedule pt   They have been trying to contact pt to schedule left several msg for pt with no return phone call

## 2013-08-30 ENCOUNTER — Telehealth: Payer: Self-pay

## 2013-08-30 NOTE — Telephone Encounter (Signed)
Please advise Eric Adams thanks

## 2013-08-31 ENCOUNTER — Telehealth: Payer: Self-pay | Admitting: Internal Medicine

## 2013-08-31 NOTE — Telephone Encounter (Signed)
Order for HST was received in July 2014. Called and attempted to schedule patient, however, pt never returned calls or didn't show up on the date it was scheduled. Pt finally stated that he would call us back. Staff message sent back to [facility] at that time that we have not been able to set patient up. Called and [facility] caller to return my call to schedule this study.   Also called Dr. Burnice Logan office to advise Hilda Blades that she will need to check if pre cert is needed for this procedure. Rhonda J Cobb

## 2013-08-31 NOTE — Telephone Encounter (Signed)
Pt is calling pulmonary to sch sleep study for 2014. Per rhonda needs to be precerted w/ insurance for home sleep study. Ok to message her back when this has been done

## 2013-09-01 NOTE — Telephone Encounter (Signed)
Pt's wife returned call and has arranged to pick up HST device on Thurs. 09/02/13. Staff message sent to [facility] to check on pre cert for this service. Rhonda J Cobb Nothing else needed on this end. Waiting to hear about the status of the pre cert. Rhonda J Cobb

## 2013-09-01 NOTE — Telephone Encounter (Signed)
Done checked with patient insurance - no authorization needed

## 2013-09-02 DIAGNOSIS — G4730 Sleep apnea, unspecified: Secondary | ICD-10-CM

## 2013-09-02 DIAGNOSIS — G4710 Hypersomnia, unspecified: Secondary | ICD-10-CM

## 2013-09-02 DIAGNOSIS — G4733 Obstructive sleep apnea (adult) (pediatric): Secondary | ICD-10-CM | POA: Insufficient documentation

## 2013-09-06 ENCOUNTER — Encounter: Payer: Self-pay | Admitting: Internal Medicine

## 2013-09-06 DIAGNOSIS — G4730 Sleep apnea, unspecified: Secondary | ICD-10-CM

## 2013-09-06 DIAGNOSIS — G4710 Hypersomnia, unspecified: Secondary | ICD-10-CM

## 2013-09-20 ENCOUNTER — Other Ambulatory Visit: Payer: Self-pay | Admitting: Internal Medicine

## 2013-09-20 DIAGNOSIS — G4733 Obstructive sleep apnea (adult) (pediatric): Secondary | ICD-10-CM

## 2013-11-02 ENCOUNTER — Telehealth: Payer: Self-pay

## 2013-11-02 NOTE — Telephone Encounter (Signed)
Left him a message that we missed him at his nurse appointment today for his Hep B #3 injection. Left our number to call us back.

## 2013-11-05 ENCOUNTER — Institutional Professional Consult (permissible substitution): Payer: BC Managed Care – PPO | Admitting: Pulmonary Disease

## 2013-11-08 ENCOUNTER — Ambulatory Visit (INDEPENDENT_AMBULATORY_CARE_PROVIDER_SITE_OTHER): Payer: BC Managed Care – PPO | Admitting: Internal Medicine

## 2013-11-08 ENCOUNTER — Encounter: Payer: Self-pay | Admitting: Pulmonary Disease

## 2013-11-08 ENCOUNTER — Ambulatory Visit (INDEPENDENT_AMBULATORY_CARE_PROVIDER_SITE_OTHER): Payer: BC Managed Care – PPO | Admitting: Pulmonary Disease

## 2013-11-08 ENCOUNTER — Encounter (INDEPENDENT_AMBULATORY_CARE_PROVIDER_SITE_OTHER): Payer: Self-pay

## 2013-11-08 VITALS — BP 134/82 | HR 84 | Temp 98.2°F | Ht 67.0 in | Wt 249.8 lb

## 2013-11-08 DIAGNOSIS — Z23 Encounter for immunization: Secondary | ICD-10-CM

## 2013-11-08 DIAGNOSIS — G4733 Obstructive sleep apnea (adult) (pediatric): Secondary | ICD-10-CM

## 2013-11-08 NOTE — Assessment & Plan Note (Signed)
The patient has moderate obstructive sleep apnea by his recent sleep study, and I have reviewed the various treatment options with him. We have discussed a dental appliance versus CPAP, while working on weight loss at the same time. I've also discussed with him the impact of sleep disordered breathing to cardiovascular health and quality of life. At this point, he would like to try a dental appliance while working on weight loss. We'll also make a referral to nutrition to help with weight loss.

## 2013-11-08 NOTE — Progress Notes (Signed)
Subjective:    Patient ID: Eric Adams, male    DOB: March 04, 1970, 43 y.o.   MRN: [id number]  HPI  The patient is a 43 year old male who I've been asked to see for management of obstructive sleep apnea. He underwent recent home sleep testing which showed moderate OSA, with an AHI of 19 events per hour and oxygen desaturation as low as 68%. The patient apparently had a sleep study over 4 years ago in [place], and tells me that he was started on CPAP as an outpatient but could not tolerate. He currently has been noted to have loud snoring by his bed partner, as well as witnessed apneas. He thinks he only awakens 1 time a night, and feels rested in the mornings upon arising. He works as a Engineer, drilling, and denies any sleepiness issues with driving. However, he will take a nap during the day in his truck 2 times a week. He also will develop sleep pressure at night if watching television his or movies that are not interesting to him. The patient states that his weight is neutral over the last 2 years, and his Epworth score today is only 3.  Sleep Questionnaire What time do you typically go to bed?( Between what hours) 9-11 PM 9-11 PM at 1542 on 11/08/13 by Inge Rise, CMA How long does it take you to fall asleep? couple minutes (depending if tired) couple minutes (depending if tired) at 1542 on 11/08/13 by Inge Rise, CMA How many times during the night do you wake up? 1 1 at 1542 on 11/08/13 by Inge Rise, CMA What time do you get out of bed to start your day? 0530 0530 at 1542 on 11/08/13 by Inge Rise, CMA Do you drive or operate heavy machinery in your occupation? Yes Yes at 1542 on 11/08/13 by Inge Rise, CMA How much has your weight changed (up or down) over the past two years? (In pounds) 5 lb (2.268 kg) 5 lb (2.268 kg) at 1542 on 11/08/13 by Inge Rise, CMA Have you ever had a sleep study before? Yes Yes at 1542 on 11/08/13 by Inge Rise, CMA If yes,  [place] of [place] at 1542 on 11/08/13 by Inge Rise, CMA If yes, date of study? 2011, 2015 2011, 2015 at 1542 on 11/08/13 by Inge Rise, CMA Do you currently use CPAP? No No at 1542 on 11/08/13 by Inge Rise, CMA Do you wear oxygen at any time? No    Review of Systems  Constitutional: Negative for fever and unexpected weight change.  HENT: Negative for congestion, dental problem, ear pain, nosebleeds, postnasal drip, rhinorrhea, sinus pressure, sneezing, sore throat and trouble swallowing.   Eyes: Negative for redness and itching.  Respiratory: Negative for cough, chest tightness, shortness of breath and wheezing.   Cardiovascular: Negative for palpitations and leg swelling.  Gastrointestinal: Negative for nausea and vomiting.  Genitourinary: Negative for dysuria.  Musculoskeletal: Negative for joint swelling.  Skin: Negative for rash.  Neurological: Negative for headaches.  Hematological: Does not bruise/bleed easily.  Psychiatric/Behavioral: Negative for dysphoric mood. The patient is not nervous/anxious.        Objective:   Physical Exam Constitutional:  Overweight male, no acute distress  HENT:  Nares patent without discharge, septal deviation to the left with narrowing.   Oropharynx without exudate, palate and uvula are very thick and elongated.   Eyes:  Perrla, eomi, no scleral  icterus  Neck:  No JVD, no TMG  Cardiovascular:  Normal rate, regular rhythm, no rubs or gallops.  No murmurs        Intact distal pulses  Pulmonary :  Normal breath sounds, no stridor or respiratory distress   No rales, rhonchi, or wheezing  Abdominal:  Soft, nondistended, bowel sounds present.  No tenderness noted.   Musculoskeletal:  No lower extremity edema noted.  Lymph Nodes:  No cervical lymphadenopathy noted  Skin:  No cyanosis noted  Neurologic:  Appears sleepy, but appropriate, moves all 4 extremities without obvious deficit.           Assessment & Plan:

## 2013-11-08 NOTE — Patient Instructions (Signed)
Will make a nutrition referral to the hospital. Work on weight loss Think about cpap vs dental appliance and let me know.

## 2013-12-23 ENCOUNTER — Ambulatory Visit: Payer: BC Managed Care – PPO | Admitting: Dietician

## 2014-08-18 ENCOUNTER — Encounter: Payer: Self-pay | Admitting: Internal Medicine

## 2014-08-18 ENCOUNTER — Ambulatory Visit (INDEPENDENT_AMBULATORY_CARE_PROVIDER_SITE_OTHER): Payer: Self-pay | Admitting: Internal Medicine

## 2014-08-18 VITALS — BP 138/90 | HR 95 | Temp 98.7°F | Resp 20 | Ht 67.0 in | Wt 253.0 lb

## 2014-08-18 DIAGNOSIS — B9681 Helicobacter pylori [H. pylori] as the cause of diseases classified elsewhere: Secondary | ICD-10-CM

## 2014-08-18 DIAGNOSIS — G4733 Obstructive sleep apnea (adult) (pediatric): Secondary | ICD-10-CM | POA: Diagnosis not present

## 2014-08-18 DIAGNOSIS — Z0000 Encounter for general adult medical examination without abnormal findings: Secondary | ICD-10-CM

## 2014-08-18 DIAGNOSIS — K760 Fatty (change of) liver, not elsewhere classified: Secondary | ICD-10-CM

## 2014-08-18 DIAGNOSIS — A048 Other specified bacterial intestinal infections: Secondary | ICD-10-CM

## 2014-08-18 NOTE — Progress Notes (Signed)
Subjective:    Patient ID: Eric Adams, male    DOB: 1970-10-06, 44 y.o.   MRN: [id number]  HPI  44 year old patient who has a prior history of H. pylori infection.  Mild fatty liver and OSA.  He is accompanied by his wife today with complaints of gaseousness and some mild abdominal discomfort. His main complaint is increased flatus.  He is off PPI therapy.  No significant diarrhea or change in his bowel habits, although at times his stools are described as explosive with the gaseousness.  No nausea, vomiting  The patient/wife also had concerns about possible acromegaly, mainly due to rings fitting much tighter. No change in shoe size over the years  Wt Readings from Last 3 Encounters:  08/18/14 253 lb (114.76 kg)  11/08/13 249 lb 12.8 oz (113.309 kg)  05/25/13 246 lb (111.585 kg)    Past Medical History  Diagnosis Date  . Hepatic steatosis   . H. pylori infection     History   Social History  . Marital Status: Single    Spouse Name: N/A  . Number of Children: 1  . Years of Education: N/A   Occupational History  . Truck Geophysicist/field seismologist    Social History Main Topics  . Smoking status: Never Smoker   . Smokeless tobacco: Never Used  . Alcohol Use: Yes     Comment: glass of wine occasionally  . Drug Use: No  . Sexual Activity: Not on file   Other Topics Concern  . Not on file   Social History Narrative    Past Surgical History  Procedure Laterality Date  . Foot surgery Right   . Breath tek h pylori N/A 12/21/2012    Procedure: BREATH TEK H PYLORI;  Surgeon: Jerene Bears, MD;  Location: [facility];  Service: Gastroenterology;  Laterality: N/A;    Family History  Problem Relation Age of Onset  . Colon cancer Neg Hx     No Known Allergies  No current outpatient prescriptions on file prior to visit.   No current facility-administered medications on file prior to visit.    BP 138/90 mmHg  Pulse 95  Temp(Src) 98.7 F (37.1 C) (Oral)  Resp 20  Ht 5\' 7"   (1.702 m)  Wt 253 lb (114.76 kg)  BMI 39.62 kg/m2  SpO2 96%     Review of Systems  Constitutional: Negative for fever, chills, appetite change and fatigue.  HENT: Negative for congestion, dental problem, ear pain, hearing loss, sore throat, tinnitus, trouble swallowing and voice change.   Eyes: Negative for pain, discharge and visual disturbance.  Respiratory: Negative for cough, chest tightness, wheezing and stridor.   Cardiovascular: Negative for chest pain, palpitations and leg swelling.  Gastrointestinal: Positive for abdominal distention. Negative for nausea, vomiting, abdominal pain, diarrhea, constipation and blood in stool.  Genitourinary: Negative for urgency, hematuria, flank pain, discharge, difficulty urinating and genital sores.  Musculoskeletal: Negative for myalgias, back pain, joint swelling, arthralgias, gait problem and neck stiffness.  Skin: Negative for rash.  Neurological: Negative for dizziness, syncope, speech difficulty, weakness, numbness and headaches.  Hematological: Negative for adenopathy. Does not bruise/bleed easily.  Psychiatric/Behavioral: Negative for behavioral problems and dysphoric mood. The patient is not nervous/anxious.        Objective:   Physical Exam  Constitutional: He is oriented to person, place, and time. He appears well-developed.  HENT:  Head: Normocephalic.  Right Ear: External ear normal.  Left Ear: External ear normal.  Eyes: Conjunctivae and  EOM are normal.  Neck: Normal range of motion.  Cardiovascular: Normal rate and normal heart sounds.   Pulmonary/Chest: Breath sounds normal.  Abdominal: Bowel sounds are normal.  Musculoskeletal: Normal range of motion. He exhibits edema. He exhibits no tenderness.  Mild lower extremity edema  Neurological: He is alert and oriented to person, place, and time.  Psychiatric: He has a normal mood and affect. His behavior is normal.          Assessment & Plan:     Gaseousness.   Patient will give a trial of a lactose-free diet for a period of time and will consider a food diary to determine cause and effect. Obesity/OSA.  Weight loss encouraged History of H. pylori infection.  No true reflux symptoms off PPI therapy  We'll report any persistent new or worsening symptoms

## 2014-08-18 NOTE — Patient Instructions (Signed)
Limit your sodium (Salt) intake    It is important that you exercise regularly, at least 20 minutes 3 to 4 times per week.  If you develop chest pain or shortness of breath seek  medical attention.  You need to lose weight.  Consider a lower calorie diet and regular exercise.  Low-Sodium Eating Plan Sodium raises blood pressure and causes water to be held in the body. Getting less sodium from food will help lower your blood pressure, reduce any swelling, and protect your heart, liver, and kidneys. We get sodium by adding salt (sodium chloride) to food. Most of our sodium comes from canned, boxed, and frozen foods. Restaurant foods, fast foods, and pizza are also very high in sodium. Even if you take medicine to lower your blood pressure or to reduce fluid in your body, getting less sodium from your food is important. WHAT IS MY PLAN? Most people should limit their sodium intake to 2,300 mg a day. Your health care provider recommends that you limit your sodium intake to __________ a day.  WHAT DO I NEED TO KNOW ABOUT THIS EATING PLAN? For the low-sodium eating plan, you will follow these general guidelines:  Choose foods with a % Daily Value for sodium of less than 5% (as listed on the food label).   Use salt-free seasonings or herbs instead of table salt or sea salt.   Check with your health care provider or pharmacist before using salt substitutes.   Eat fresh foods.  Eat more vegetables and fruits.  Limit canned vegetables. If you do use them, rinse them well to decrease the sodium.   Limit cheese to 1 oz (28 g) per day.   Eat lower-sodium products, often labeled as "lower sodium" or "no salt added."  Avoid foods that contain monosodium glutamate (MSG). MSG is sometimes added to [place] food and some canned foods.  Check food labels (Nutrition Facts labels) on foods to learn how much sodium is in one serving.  Eat more home-cooked food and less restaurant, buffet, and fast  food.  When eating at a restaurant, ask that your food be prepared with less salt or none, if possible.  HOW DO I READ FOOD LABELS FOR SODIUM INFORMATION? The Nutrition Facts label lists the amount of sodium in one serving of the food. If you eat more than one serving, you must multiply the listed amount of sodium by the number of servings. Food labels may also identify foods as:  Sodium free--Less than 5 mg in a serving.  Very low sodium--35 mg or less in a serving.  Low sodium--140 mg or less in a serving.  Light in sodium--50% less sodium in a serving. For example, if a food that usually has 300 mg of sodium is changed to become light in sodium, it will have 150 mg of sodium.  Reduced sodium--25% less sodium in a serving. For example, if a food that usually has 400 mg of sodium is changed to reduced sodium, it will have 300 mg of sodium. WHAT FOODS CAN I EAT? Grains Low-sodium cereals, including oats, puffed wheat and rice, and shredded wheat cereals. Low-sodium crackers. Unsalted rice and pasta. Lower-sodium bread.  Vegetables Frozen or fresh vegetables. Low-sodium or reduced-sodium canned vegetables. Low-sodium or reduced-sodium tomato sauce and paste. Low-sodium or reduced-sodium tomato and vegetable juices.  Fruits Fresh, frozen, and canned fruit. Fruit juice.  Meat and Other Protein Products Low-sodium canned tuna and salmon. Fresh or frozen meat, poultry, seafood, and fish. Lamb. Unsalted  nuts. Dried beans, peas, and lentils without added salt. Unsalted canned beans. Homemade soups without salt. Eggs.  Dairy Milk. Soy milk. Ricotta cheese. Low-sodium or reduced-sodium cheeses. Yogurt.  Condiments Fresh and dried herbs and spices. Salt-free seasonings. Onion and garlic powders. Low-sodium varieties of mustard and ketchup. Lemon juice.  Fats and Oils Reduced-sodium salad dressings. Unsalted butter.  Other Unsalted popcorn and pretzels.  The items listed above may  not be a complete list of recommended foods or beverages. Contact your dietitian for more options. WHAT FOODS ARE NOT RECOMMENDED? Grains Instant hot cereals. Bread stuffing, pancake, and biscuit mixes. Croutons. Seasoned rice or pasta mixes. Noodle soup cups. Boxed or frozen macaroni and cheese. Self-rising flour. Regular salted crackers. Vegetables Regular canned vegetables. Regular canned tomato sauce and paste. Regular tomato and vegetable juices. Frozen vegetables in sauces. Salted french fries. Olives. Eric Adams. Relishes. Sauerkraut. Salsa. Meat and Other Protein Products Salted, canned, smoked, spiced, or pickled meats, seafood, or fish. Bacon, ham, sausage, hot dogs, corned beef, chipped beef, and packaged luncheon meats. Salt pork. Jerky. Pickled herring. Anchovies, regular canned tuna, and sardines. Salted nuts. Dairy Processed cheese and cheese spreads. Cheese curds. Blue cheese and cottage cheese. Buttermilk.  Condiments Onion and garlic salt, seasoned salt, table salt, and sea salt. Canned and packaged gravies. Worcestershire sauce. Tartar sauce. Barbecue sauce. Teriyaki sauce. Soy sauce, including reduced sodium. Steak sauce. Fish sauce. Oyster sauce. Cocktail sauce. Horseradish. Regular ketchup and mustard. Meat flavorings and tenderizers. Bouillon cubes. Hot sauce. Tabasco sauce. Marinades. Taco seasonings. Relishes. Fats and Oils Regular salad dressings. Salted butter. Margarine. Ghee. Bacon fat.  Other Potato and tortilla chips. Corn chips and puffs. Salted popcorn and pretzels. Canned or dried soups. Pizza. Frozen entrees and pot pies.  The items listed above may not be a complete list of foods and beverages to avoid. Contact your dietitian for more information. Document Released: 08/03/2001 Document Revised: 02/16/2013 Document Reviewed: 12/16/2012 [facility] Patient Information 2015 [place], [place]. This information is not intended to replace advice given to you by your  health care provider. Make sure you discuss any questions you have with your health care provider. Lactose Intolerance, Adult Lactose intolerance is when the body is not able to digest lactose, a sugar found in milk and milk products. Lactose intolerance is caused by your body not producing enough of the enzyme lactase. When there is not enough lactase to digest the amount of lactose consumed, discomfort may be felt. Lactose intolerance is not a milk allergy. For most people, lactase deficiency is a condition that develops naturally over time. After about the age of 2, the body begins to produce less lactase. But many people may not experience symptoms until they are much older. CAUSES Things that can cause you to be lactose intolerant include:  Aging.  Being born without the ability to make lactase.  Certain digestive diseases.  Injuries to the small intestine. SYMPTOMS   Feeling sick to your stomach (nauseous).  Diarrhea.  Cramps.  Bloating.  Gas. Symptoms usually show up a half hour or 2 hours after eating or drinking products containing lactose. TREATMENT  No treatment can improve the body's ability to produce lactase. However, symptoms can be controlled through diet. A medicine may be given to you to take when you consume lactose-containing foods or drinks. The medicine contains the lactase enzyme, which help the body digest lactose better. HOME CARE INSTRUCTIONS  Eat or drink dairy products as told by your caregiver or dietician.  Take all medicine  as directed by your caregiver.  Find lactose-free or lactose-reduced products at your local grocery store.  Talk to your caregiver or dietician to decide if you need any dietary supplements. The following is the amount of calcium needed from the diet:  19 to 50 years: 1000 mg  Over 50 years: 1200 mg Calcium and Lactose in Common Foods Non-Dairy Products / Calcium Content (mg)  Calcium-fortified orange juice, 1 cup / 308  to 344 mg  Sardines, with edible bones, 3 oz / 270 mg  Salmon, canned, with edible bones, 3 oz / 205 mg  Soymilk, fortified, 1 cup / 200 mg  Broccoli (raw), 1 cup / 90 mg  Orange, 1 medium / 50 mg  Pinto beans,  cup / 40 mg  Tuna, canned, 3 oz / 10 mg  Lettuce greens,  cup / 10 mg Dairy Products / Calcium Content (mg) / Lactose Content (g)  Yogurt, plain, low-fat, 1 cup / 415 mg / 5 g  Milk, reduced fat, 1 cup / 295 mg / 11 g  Swiss cheese, 1 oz / 270 mg / 1 g  Ice cream,  cup / 85 mg / 6 g  Cottage cheese,  cup / 75 mg / 2 to 3 g SEEK MEDICAL CARE IF: You have no relief from your symptoms. Document Released: 02/11/2005 Document Revised: 05/06/2011 Document Reviewed: 05/14/2013 [facility] Patient Information 2015 [place], [place]. This information is not intended to replace advice given to you by your health care provider. Make sure you discuss any questions you have with your health care provider.

## 2014-08-18 NOTE — Progress Notes (Signed)
Pre visit review using our clinic review tool, if applicable. No additional management support is needed unless otherwise documented below in the visit note. 

## 2014-08-19 ENCOUNTER — Encounter: Payer: Self-pay | Admitting: Internal Medicine

## 2014-08-19 LAB — COMPREHENSIVE METABOLIC PANEL
ALBUMIN: 4.6 g/dL (ref 3.5–5.2)
ALT: 52 U/L (ref 0–53)
AST: 41 U/L — AB (ref 0–37)
Alkaline Phosphatase: 124 U/L — ABNORMAL HIGH (ref 39–117)
BUN: 11 mg/dL (ref 6–23)
CALCIUM: 10.2 mg/dL (ref 8.4–10.5)
CHLORIDE: 101 meq/L (ref 96–112)
CO2: 29 mEq/L (ref 19–32)
Creatinine, Ser: 1.15 mg/dL (ref 0.40–1.50)
GFR: 73.41 mL/min (ref >60.00)
Glucose, Bld: 169 mg/dL — ABNORMAL HIGH (ref 70–99)
POTASSIUM: 3.9 meq/L (ref 3.5–5.1)
Sodium: 137 mEq/L (ref 135–145)
Total Bilirubin: 0.7 mg/dL (ref 0.2–1.2)
Total Protein: 8.6 g/dL — ABNORMAL HIGH (ref 6.0–8.3)

## 2014-08-19 LAB — CBC WITH DIFFERENTIAL/PLATELET
BASOS PCT: 0.6 % (ref 0.0–3.0)
Basophils Absolute: 0 10*3/uL (ref 0.0–0.1)
Eosinophils Absolute: 0.1 10*3/uL (ref 0.0–0.7)
Eosinophils Relative: 0.9 % (ref 0.0–5.0)
HCT: 45 % (ref 39.0–52.0)
Hemoglobin: 15.4 g/dL (ref 13.0–17.0)
LYMPHS ABS: 2.8 10*3/uL (ref 0.7–4.0)
Lymphocytes Relative: 50.3 % — ABNORMAL HIGH (ref 12.0–46.0)
MCHC: 34.2 g/dL (ref 30.0–36.0)
MCV: 81.4 fl (ref 78.0–100.0)
Monocytes Absolute: 0.5 10*3/uL (ref 0.1–1.0)
Monocytes Relative: 8.8 % (ref 3.0–12.0)
Neutro Abs: 2.2 10*3/uL (ref 1.4–7.7)
Neutrophils Relative %: 39.4 % — ABNORMAL LOW (ref 43.0–77.0)
PLATELETS: 291 10*3/uL (ref 150.0–400.0)
RBC: 5.53 Mil/uL (ref 4.22–5.81)
RDW: 14.6 % (ref 11.5–15.5)
WBC: 5.5 10*3/uL (ref 4.0–10.5)

## 2014-08-19 LAB — TSH: TSH: 1.83 u[IU]/mL (ref 0.35–4.50)

## 2014-08-22 ENCOUNTER — Other Ambulatory Visit (INDEPENDENT_AMBULATORY_CARE_PROVIDER_SITE_OTHER): Payer: 59

## 2014-08-22 ENCOUNTER — Other Ambulatory Visit: Payer: Self-pay | Admitting: *Deleted

## 2014-08-22 DIAGNOSIS — R739 Hyperglycemia, unspecified: Secondary | ICD-10-CM

## 2014-08-22 DIAGNOSIS — E1169 Type 2 diabetes mellitus with other specified complication: Secondary | ICD-10-CM | POA: Insufficient documentation

## 2014-08-22 DIAGNOSIS — E118 Type 2 diabetes mellitus with unspecified complications: Secondary | ICD-10-CM | POA: Insufficient documentation

## 2014-08-22 LAB — HEMOGLOBIN A1C: Hgb A1c MFr Bld: 8.6 % — ABNORMAL HIGH (ref 4.6–6.5)

## 2014-08-22 MED ORDER — METFORMIN HCL 500 MG PO TABS: 500.0000 mg | ORAL_TABLET | Freq: Two times a day (BID) | ORAL | Status: DC

## 2014-08-24 ENCOUNTER — Telehealth: Payer: Self-pay | Admitting: Internal Medicine

## 2014-08-24 DIAGNOSIS — E119 Type 2 diabetes mellitus without complications: Secondary | ICD-10-CM

## 2014-08-24 NOTE — Telephone Encounter (Signed)
Dr. Raliegh Ip, okay to do referral to Endo?

## 2014-08-24 NOTE — Telephone Encounter (Signed)
Patient's wife is requesting a referral to endocrinology.

## 2014-08-25 NOTE — Telephone Encounter (Signed)
ok 

## 2014-08-25 NOTE — Telephone Encounter (Signed)
Left detailed message order for Endocrinologist referral was done and someone will contact you regarding an appointment. Any questions please call office.

## 2014-08-30 ENCOUNTER — Encounter: Payer: Self-pay | Admitting: Internal Medicine

## 2014-08-30 ENCOUNTER — Ambulatory Visit (INDEPENDENT_AMBULATORY_CARE_PROVIDER_SITE_OTHER): Payer: 59 | Admitting: Internal Medicine

## 2014-08-30 ENCOUNTER — Other Ambulatory Visit: Payer: Self-pay | Admitting: *Deleted

## 2014-08-30 VITALS — BP 140/98 | HR 92 | Temp 98.3°F | Resp 20 | Ht 67.0 in | Wt 247.0 lb

## 2014-08-30 DIAGNOSIS — E119 Type 2 diabetes mellitus without complications: Secondary | ICD-10-CM | POA: Diagnosis not present

## 2014-08-30 DIAGNOSIS — E669 Obesity, unspecified: Secondary | ICD-10-CM | POA: Diagnosis not present

## 2014-08-30 LAB — GLUCOSE, POCT (MANUAL RESULT ENTRY): POC GLUCOSE: 110 mg/dL — AB (ref 70–99)

## 2014-08-30 MED ORDER — GLUCOSE BLOOD VI STRP: ORAL_STRIP | Status: DC

## 2014-08-30 MED ORDER — ONETOUCH LANCETS MISC: Status: DC

## 2014-08-30 MED ORDER — EMPAGLIFLOZIN 25 MG PO TABS: 25.0000 mg | ORAL_TABLET | Freq: Every day | ORAL | Status: DC

## 2014-08-30 NOTE — Progress Notes (Signed)
Subjective:    Patient ID: Eric Adams, male    DOB: 1971/01/21, 44 y.o.   MRN: [id number]  HPI  44 year old patient who is seen today for follow-up of new onset diabetes.  The patient has a history of mild impaired glucose tolerance and noted to have a fasting blood sugar of 169.  One week ago.  Hemoglobin A1c 8.6 He has been on metformin therapy 500 mg twice daily and has had some mild diarrhea.  His wife has requested endocrinology referral and this has been set up No hyperglycemic symptoms.  Random blood sugar today 110  BP Readings from Last 3 Encounters:  08/30/14 140/98  08/18/14 138/90  11/08/13 134/82    Wt Readings from Last 3 Encounters:  08/30/14 247 lb (112.038 kg)  08/18/14 253 lb (114.76 kg)  11/08/13 249 lb 12.8 oz (113.309 kg)   Past Medical History  Diagnosis Date  . Hepatic steatosis   . H. pylori infection     History   Social History  . Marital Status: Single    Spouse Name: N/A  . Number of Children: 1  . Years of Education: N/A   Occupational History  . Truck Geophysicist/field seismologist    Social History Main Topics  . Smoking status: Never Smoker   . Smokeless tobacco: Never Used  . Alcohol Use: Yes     Comment: glass of wine occasionally  . Drug Use: No  . Sexual Activity: Not on file   Other Topics Concern  . Not on file   Social History Narrative    Past Surgical History  Procedure Laterality Date  . Foot surgery Right   . Breath tek h pylori N/A 12/21/2012    Procedure: BREATH TEK H PYLORI;  Surgeon: Jerene Bears, MD;  Location: [facility];  Service: Gastroenterology;  Laterality: N/A;    Family History  Problem Relation Age of Onset  . Colon cancer Neg Hx     No Known Allergies  Current Outpatient Prescriptions on File Prior to Visit  Medication Sig Dispense Refill  . metFORMIN (GLUCOPHAGE) 500 MG tablet Take 1 tablet (500 mg total) by mouth 2 (two) times daily with a meal. 180 tablet 1   No current facility-administered medications  on file prior to visit.    BP 140/98 mmHg  Pulse 92  Temp(Src) 98.3 F (36.8 C) (Oral)  Resp 20  Ht 5\' 7"  (1.702 m)  Wt 247 lb (112.038 kg)  BMI 38.68 kg/m2  SpO2 95%       Review of Systems  Constitutional: Negative for fever, chills, appetite change and fatigue.  HENT: Negative for congestion, dental problem, ear pain, hearing loss, sore throat, tinnitus, trouble swallowing and voice change.   Eyes: Negative for pain, discharge and visual disturbance.  Respiratory: Negative for cough, chest tightness, wheezing and stridor.   Cardiovascular: Negative for chest pain, palpitations and leg swelling.  Gastrointestinal: Negative for nausea, vomiting, abdominal pain, diarrhea, constipation, blood in stool and abdominal distention.  Genitourinary: Negative for urgency, hematuria, flank pain, discharge, difficulty urinating and genital sores.  Musculoskeletal: Negative for myalgias, back pain, joint swelling, arthralgias, gait problem and neck stiffness.  Skin: Negative for rash.  Neurological: Negative for dizziness, syncope, speech difficulty, weakness, numbness and headaches.  Hematological: Negative for adenopathy. Does not bruise/bleed easily.  Psychiatric/Behavioral: Negative for behavioral problems and dysphoric mood. The patient is not nervous/anxious.        Objective:   Physical Exam  Constitutional: He appears well-developed  and well-nourished. No distress.  Repeat blood pressure 130/90 Weight 247          Assessment & Plan:    New-onset diabetes.  Nonpharmacologic management discussed at length.  Patient information provided.  Endocrinology follow-up has been scheduled per patient request. Patient given home glucometer and instructions for home use.  He has been asked to check his blood sugars twice daily fasting and prior to his evening meal and to keep a diary. Due to diarrhea (and blood sugar much improved with random blood sugar 110), we'll decrease  metformin to 500 mg daily with his largest meal.  Will add SGLT 2  Obesity.  Weight loss encouraged Rule out hypertension.  Hopeful will improve with better diet, weight loss exercise and SGLT 2 drug

## 2014-08-30 NOTE — Progress Notes (Signed)
Pre visit review using our clinic review tool, if applicable. No additional management support is needed unless otherwise documented below in the visit note. 

## 2014-08-30 NOTE — Patient Instructions (Signed)
Please check your hemoglobin A1c every 3 months    It is important that you exercise regularly, at least 20 minutes 3 to 4 times per week.  If you develop chest pain or shortness of breath seek  medical attention.  You need to lose weight.  Consider a lower calorie diet and regular exercise.  Endocrinology follow-up as scheduled  Jardiance 10 mg daily for 2 weeks and then 25 mg daily  Blood Glucose Monitoring Monitoring your blood glucose (also know as blood sugar) helps you to manage your diabetes. It also helps you and your health care provider monitor your diabetes and determine how well your treatment plan is working. WHY SHOULD YOU MONITOR YOUR BLOOD GLUCOSE?  It can help you understand how food, exercise, and medicine affect your blood glucose.  It allows you to know what your blood glucose is at any given moment. You can quickly tell if you are having low blood glucose (hypoglycemia) or high blood glucose (hyperglycemia).  It can help you and your health care provider know how to adjust your medicines.  It can help you understand how to manage an illness or adjust medicine for exercise. WHEN SHOULD YOU TEST? Your health care provider will help you decide how often you should check your blood glucose. This may depend on the type of diabetes you have, your diabetes control, or the types of medicines you are taking. Be sure to write down all of your blood glucose readings so that this information can be reviewed with your health care provider. See below for examples of testing times that your health care provider may suggest. Type 1 Diabetes  Test 4 times a day if you are in good control, using an insulin pump, or perform multiple daily injections.  If your diabetes is not well controlled or if you are sick, you may need to monitor more often.  It is a good idea to also monitor:  Before and after exercise.  Between meals and 2 hours after a meal.  Occasionally between 2:00  a.m. and 3:00 a.m. Type 2 Diabetes  It can vary with each person, but generally, if you are on insulin, test 4 times a day.  If you take medicines by mouth (orally), test 2 times a day.  If you are on a controlled diet, test once a day.  If your diabetes is not well controlled or if you are sick, you may need to monitor more often. HOW TO MONITOR YOUR BLOOD GLUCOSE Supplies Needed  Blood glucose meter.  Test strips for your meter. Each meter has its own strips. You must use the strips that go with your own meter.  A pricking needle (lancet).  A device that holds the lancet (lancing device).  A journal or log book to write down your results. Procedure  Wash your hands with soap and water. Alcohol is not preferred.  Prick the side of your finger (not the tip) with the lancet.  Gently milk the finger until a small drop of blood appears.  Follow the instructions that come with your meter for inserting the test strip, applying blood to the strip, and using your blood glucose meter. Other Areas to Get Blood for Testing Some meters allow you to use other areas of your body (other than your finger) to test your blood. These areas are called alternative sites. The most common alternative sites are:  The forearm.  The thigh.  The back area of the lower leg.  The palm  of the hand. The blood flow in these areas is slower. Therefore, the blood glucose values you get may be delayed, and the numbers are different from what you would get from your fingers. Do not use alternative sites if you think you are having hypoglycemia. Your reading will not be accurate. Always use a finger if you are having hypoglycemia. Also, if you cannot feel your lows (hypoglycemia unawareness), always use your fingers for your blood glucose checks. ADDITIONAL TIPS FOR GLUCOSE MONITORING  Do not reuse lancets.  Always carry your supplies with you.  All blood glucose meters have a 24-hour "hotline" number to  call if you have questions or need help.  Adjust (calibrate) your blood glucose meter with a control solution after finishing a few boxes of strips. BLOOD GLUCOSE RECORD KEEPING It is a good idea to keep a daily record or log of your blood glucose readings. Most glucose meters, if not all, keep your glucose records stored in the meter. Some meters come with the ability to download your records to your home computer. Keeping a record of your blood glucose readings is especially helpful if you are wanting to look for patterns. Make notes to go along with the blood glucose readings because you might forget what happened at that exact time. Keeping good records helps you and your health care provider to work together to achieve good diabetes management.  Document Released: 02/14/2003 Document Revised: 06/28/2013 Document Reviewed: 07/06/2012 [facility] Patient Information 2015 [place], [place]. This information is not intended to replace advice given to you by your health care provider. Make sure you discuss any questions you have with your health care provider. Diabetes and Exercise Exercising regularly is important. It is not just about losing weight. It has many health benefits, such as:  Improving your overall fitness, flexibility, and endurance.  Increasing your bone density.  Helping with weight control.  Decreasing your body fat.  Increasing your muscle strength.  Reducing stress and tension.  Improving your overall health. People with diabetes who exercise gain additional benefits because exercise:  Reduces appetite.  Improves the body's use of blood sugar (glucose).  Helps lower or control blood glucose.  Decreases blood pressure.  Helps control blood lipids (such as cholesterol and triglycerides).  Improves the body's use of the hormone insulin by:  Increasing the body's insulin sensitivity.  Reducing the body's insulin needs.  Decreases the risk for heart disease because  exercising:  Lowers cholesterol and triglycerides levels.  Increases the levels of good cholesterol (such as high-density lipoproteins [HDL]) in the body.  Lowers blood glucose levels. YOUR ACTIVITY PLAN  Choose an activity that you enjoy and set realistic goals. Your health care provider or diabetes educator can help you make an activity plan that works for you. Exercise regularly as directed by your health care provider. This includes:  Performing resistance training twice a week such as push-ups, sit-ups, lifting weights, or using resistance bands.  Performing 150 minutes of cardio exercises each week such as walking, running, or playing sports.  Staying active and spending no more than 90 minutes at one time being inactive. Even short bursts of exercise are good for you. Three 10-minute sessions spread throughout the day are just as beneficial as a single 30-minute session. Some exercise ideas include:  Taking the dog for a walk.  Taking the stairs instead of the elevator.  Dancing to your favorite song.  Doing an exercise video.  Doing your favorite exercise with a friend. RECOMMENDATIONS  FOR EXERCISING WITH TYPE 1 OR TYPE 2 DIABETES   Check your blood glucose before exercising. If blood glucose levels are greater than 240 mg/dL, check for urine ketones. Do not exercise if ketones are present.  Avoid injecting insulin into areas of the body that are going to be exercised. For example, avoid injecting insulin into:  The arms when playing tennis.  The legs when jogging.  Keep a record of:  Food intake before and after you exercise.  Expected peak times of insulin action.  Blood glucose levels before and after you exercise.  The type and amount of exercise you have done.  Review your records with your health care provider. Your health care provider will help you to develop guidelines for adjusting food intake and insulin amounts before and after exercising.  If you  take insulin or oral hypoglycemic agents, watch for signs and symptoms of hypoglycemia. They include:  Dizziness.  Shaking.  Sweating.  Chills.  Confusion.  Drink plenty of water while you exercise to prevent dehydration or heat stroke. Body water is lost during exercise and must be replaced.  Talk to your health care provider before starting an exercise program to make sure it is safe for you. Remember, almost any type of activity is better than none. Document Released: 05/04/2003 Document Revised: 06/28/2013 Document Reviewed: 07/21/2012 [facility] Patient Information 2015 [place], [place]. This information is not intended to replace advice given to you by your health care provider. Make sure you discuss any questions you have with your health care provider. Diabetes, Eating Away From Home Sometimes, you might eat in a restaurant or have meals that are prepared by someone else. You can enjoy eating out. However, the portions in restaurants may be much larger than needed. Listed below are some ideas to help you choose foods that will keep your blood glucose (sugar) in better control.  TIPS FOR EATING OUT  Know your meal plan and how many carbohydrate servings you should have at each meal. You may wish to carry a copy of your meal plan in your purse or wallet. Learn the foods included in each food group.  Make a list of restaurants near you that offer healthy choices. Take a copy of the carry-out menus to see what they offer. Then, you can plan what you will order ahead of time.  Become familiar with serving sizes by practicing them at home using measuring cups and spoons. Once you learn to recognize portion sizes, you will be able to correctly estimate the amount of total carbohydrate you are allowed to eat at the restaurant. Ask for a takeout box if the portion is more than you should have. When your food comes, leave the amount you should have on the plate, and put the rest in the takeout  box before you start eating.  Plan ahead if your mealtime will be different from usual. Check with your caregiver to find out how to time meals and medicine if you are taking insulin.  Avoid high-fat foods, such as fried foods, cream sauces, high-fat salad dressings, or any added butter or margarine.  Do not be afraid to ask questions. Ask your server about the portion size, cooking methods, ingredients and if items can be substituted. Restaurants do not list all available items on the menu. You can ask for your main entree to be prepared using skim milk, oil instead of butter or margarine, and without gravy or sauces. Ask your waiter or waitress to serve salad dressings,  gravy, sauces, margarine, and sour cream on the side. You can then add the amount your meal plan suggests.  Add more vegetables whenever possible.  Avoid items that are labeled "jumbo," "giant," "deluxe," or "supersized."  You may want to split an entre with someone and order an extra side salad.  Watch for hidden calories in foods like croutons, bacon, or cheese.  Ask your server to take away the bread basket or chips from your table.  Order a dinner salad as an appetizer. You can eat most foods served in a restaurant. Some foods are better choices than others. Breads and Starches  Recommended: All kinds of bread (wheat, rye, white, oatmeal, [place], [place], raisin), hard or soft dinner rolls, frankfurter or hamburger buns, small bagels, small corn or whole-wheat flour tortillas.  Avoid: Frosted or glazed breads, butter rolls, egg or cheese breads, croissants, sweet rolls, pastries, coffee cake, glazed or frosted doughnuts, muffins. Crackers  Recommended: Animal crackers, graham, rye, saltine, oyster, and matzoth crackers. Bread sticks, melba toast, rusks, pretzels, popcorn (without fat), zwieback toast.  Avoid: High-fat snack crackers or chips. Buttered popcorn. Cereals  Recommended: Hot and cold cereals. Whole  grains such as oatmeal or shredded wheat are good choices.  Avoid: Sugar-coated or granola type cereals. Potatoes/Pasta/Rice/Beans  Recommended: Order baked, boiled, or mashed potatoes, rice or noodles without added fat, whole beans. Order gravies, butter, margarine, or sauces on the side so you can control the amount you add.  Avoid: Hash browns or fried potatoes. Potatoes, pasta, or rice prepared with cream or cheese sauce. Potato or pasta salads prepared with large amounts of dressing. Fried beans or fried rice. Vegetables  Recommended: Order steamed, baked, boiled, or stewed vegetables without sauces or extra fat. Ask that sauce be served on the side. If vegetables are not listed on the menu, ask what is available.  Avoid: Vegetables prepared with cream, butter, or cheese sauce. Fried vegetables. Salad Bars  Recommended: Many of the vegetables at a salad bar are considered "free." Use lemon juice, vinegar, or low-calorie salad dressing (fewer than 20 calories per serving) as "free" dressings for your salad. Look for salad bar ingredients that have no added fat or sugar such as tomatoes, lettuce, cucumbers, broccoli, carrots, onions, and mushrooms.  Avoid: Prepared salads with large amounts of dressing, such as coleslaw, caesar salad, macaroni salad, bean salad, or carrot salad. Fruit  Recommended: Eat fresh fruit or fresh fruit salad without added dressing. A salad bar often offers fresh fruit choices, but canned fruit at a restaurant is usually packed in sugar or syrup.  Avoid: Sweetened canned or frozen fruits, plain or sweetened fruit juice. Fruit salads with dressing, sour cream, or sugar added to them. Meat and Meat Substitutes  Recommended: Order broiled, baked, roasted, or grilled meat, poultry, or fish. Trim off all visible fat. Do not eat the skin of poultry. The size stated on the menu is the raw weight. Meat shrinks by  in cooking (for example, 4 oz raw equals 3 oz cooked  meat).  Avoid: Deep-fat fried meat, poultry, or fish. Breaded meats. Eggs  Recommended: Order soft, hard-cooked, poached, or scrambled eggs. Omelets may be okay, depending on what ingredients are added. Egg substitutes are also a good choice.  Avoid: Fried eggs, eggs prepared with cream or cheese sauce. Milk  Recommended: Order low-fat or fat-free milk according to your meal plan. Plain, nonfat yogurt or flavored yogurt with no sugar added may be used as a substitute for milk. Soy  milk may also be used.  Avoid: Milk shakes or sweetened milk beverages. Soups and Combination Foods  Recommended: Clear broth or consomm are "free" foods and may be used as an appetizer. Broth-based soups with fat removed count as a starch serving and are preferred over cream soups. Soups made with beans or split peas may be eaten but count as a starch.  Avoid: Fatty soups, soup made with cream, cheese soup. Combination foods prepared with excessive amounts of fat or with cream or cheese sauces. Desserts and Sweets  Recommended: Ask for fresh fruit. Sponge or angel food cake without icing, ice milk, no sugar added ice cream, sherbet, or frozen yogurt may fit into your meal plan occasionally.  Avoid: Pastries, puddings, pies, cakes with icing, custard, gelatin desserts. Fats and Oils  Recommended: Choose healthy fats such as olive oil, canola oil, or tub margarine, reduced fat or fat-free sour cream, cream cheese, avocado, or nuts.  Avoid: Any fats in excess of your allowed portion. Deep-fried foods or any food with a large amount of fat. Note: Ask for all fats to be served on the side, and limit your portion sizes according to your meal plan. Document Released: 02/11/2005 Document Revised: 05/06/2011 Document Reviewed: 05/11/2013 [facility] Patient Information 2015 [place], [place]. This information is not intended to replace advice given to you by your health care provider. Make sure you discuss any questions  you have with your health care provider. Diabetes Mellitus and Food It is important for you to manage your blood sugar (glucose) level. Your blood glucose level can be greatly affected by what you eat. Eating healthier foods in the appropriate amounts throughout the day at about the same time each day will help you control your blood glucose level. It can also help slow or prevent worsening of your diabetes mellitus. Healthy eating may even help you improve the level of your blood pressure and reach or maintain a healthy weight.  HOW CAN FOOD AFFECT ME? Carbohydrates Carbohydrates affect your blood glucose level more than any other type of food. Your dietitian will help you determine how many carbohydrates to eat at each meal and teach you how to count carbohydrates. Counting carbohydrates is important to keep your blood glucose at a healthy level, especially if you are using insulin or taking certain medicines for diabetes mellitus. Alcohol Alcohol can cause sudden decreases in blood glucose (hypoglycemia), especially if you use insulin or take certain medicines for diabetes mellitus. Hypoglycemia can be a life-threatening condition. Symptoms of hypoglycemia (sleepiness, dizziness, and disorientation) are similar to symptoms of having too much alcohol.  If your health care provider has given you approval to drink alcohol, do so in moderation and use the following guidelines:  Women should not have more than one drink per day, and men should not have more than two drinks per day. One drink is equal to:  12 oz of beer.  5 oz of wine.  1 oz of hard liquor.  Do not drink on an empty stomach.  Keep yourself hydrated. Have water, diet soda, or unsweetened iced tea.  Regular soda, juice, and other mixers might contain a lot of carbohydrates and should be counted. WHAT FOODS ARE NOT RECOMMENDED? As you make food choices, it is important to remember that all foods are not the same. Some foods have  fewer nutrients per serving than other foods, even though they might have the same number of calories or carbohydrates. It is difficult to get your body what it  needs when you eat foods with fewer nutrients. Examples of foods that you should avoid that are high in calories and carbohydrates but low in nutrients include:  Trans fats (most processed foods list trans fats on the Nutrition Facts label).  Regular soda.  Juice.  Candy.  Sweets, such as cake, pie, doughnuts, and cookies.  Fried foods. WHAT FOODS CAN I EAT? Have nutrient-rich foods, which will nourish your body and keep you healthy. The food you should eat also will depend on several factors, including:  The calories you need.  The medicines you take.  Your weight.  Your blood glucose level.  Your blood pressure level.  Your cholesterol level. You also should eat a variety of foods, including:  Protein, such as meat, poultry, fish, tofu, nuts, and seeds (lean animal proteins are best).  Fruits.  Vegetables.  Dairy products, such as milk, cheese, and yogurt (low fat is best).  Breads, grains, pasta, cereal, rice, and beans.  Fats such as olive oil, trans fat-free margarine, canola oil, avocado, and olives. DOES EVERYONE WITH DIABETES MELLITUS HAVE THE SAME MEAL PLAN? Because every person with diabetes mellitus is different, there is not one meal plan that works for everyone. It is very important that you meet with a dietitian who will help you create a meal plan that is just right for you. Document Released: 11/08/2004 Document Revised: 02/16/2013 Document Reviewed: 01/08/2013 [facility] Patient Information 2015 [place], [place]. This information is not intended to replace advice given to you by your health care provider. Make sure you discuss any questions you have with your health care provider.

## 2014-09-12 ENCOUNTER — Telehealth: Payer: Self-pay | Admitting: Internal Medicine

## 2014-09-12 NOTE — Telephone Encounter (Signed)
Yes, that is fine. 

## 2014-09-12 NOTE — Telephone Encounter (Signed)
Pt has ?poison ivy on arms. Can I use sda slot for tomorrow?

## 2014-09-12 NOTE — Telephone Encounter (Signed)
Pt has been sch

## 2014-09-13 ENCOUNTER — Ambulatory Visit (INDEPENDENT_AMBULATORY_CARE_PROVIDER_SITE_OTHER): Payer: 59 | Admitting: Internal Medicine

## 2014-09-13 ENCOUNTER — Encounter: Payer: Self-pay | Admitting: Internal Medicine

## 2014-09-13 VITALS — BP 130/80 | HR 81 | Temp 98.3°F | Resp 20 | Ht 67.0 in | Wt 244.0 lb

## 2014-09-13 DIAGNOSIS — E119 Type 2 diabetes mellitus without complications: Secondary | ICD-10-CM | POA: Diagnosis not present

## 2014-09-13 DIAGNOSIS — L237 Allergic contact dermatitis due to plants, except food: Secondary | ICD-10-CM

## 2014-09-13 MED ORDER — TRIAMCINOLONE ACETONIDE 0.1 % EX CREA: 1.0000 "application " | TOPICAL_CREAM | Freq: Two times a day (BID) | CUTANEOUS | Status: DC

## 2014-09-13 NOTE — Progress Notes (Signed)
Pre visit review using our clinic review tool, if applicable. No additional management support is needed unless otherwise documented below in the visit note. 

## 2014-09-13 NOTE — Patient Instructions (Signed)

## 2014-09-13 NOTE — Progress Notes (Signed)
   Subjective:    Patient ID: Eric Adams, male    DOB: Aug 15, 1970, 44 y.o.   MRN: [id number]  HPI  44 year old patient who was doing yard work last week and was exposed to poison ivy.  He has had a dermatitis involving the arms for the past 4 days.  He has been using calamine lotion.  No new eruptions  He has a history of recently diagnosed diabetes.  He continues with home blood pressure monitoring with nice results.  He remains on submaximal metformin dose due to some initial diarrhea.  He seems to tolerate his medications well  at the present time.  Per his request.  He is scheduled to see endocrinology next month  Past Medical History  Diagnosis Date  . Hepatic steatosis   . H. pylori infection     History   Social History  . Marital Status: Single    Spouse Name: N/A  . Number of Children: 1  . Years of Education: N/A   Occupational History  . Truck Geophysicist/field seismologist    Social History Main Topics  . Smoking status: Never Smoker   . Smokeless tobacco: Never Used  . Alcohol Use: Yes     Comment: glass of wine occasionally  . Drug Use: No  . Sexual Activity: Not on file   Other Topics Concern  . Not on file   Social History Narrative    Past Surgical History  Procedure Laterality Date  . Foot surgery Right   . Breath tek h pylori N/A 12/21/2012    Procedure: BREATH TEK H PYLORI;  Surgeon: Jerene Bears, MD;  Location: [facility];  Service: Gastroenterology;  Laterality: N/A;    Family History  Problem Relation Age of Onset  . Colon cancer Neg Hx     No Known Allergies  Current Outpatient Prescriptions on File Prior to Visit  Medication Sig Dispense Refill  . empagliflozin (JARDIANCE) 25 MG TABS tablet Take 25 mg by mouth daily. 30 tablet 3  . glucose blood (ONETOUCH VERIO) test strip USE TO CHECK BLOOD SUGAR TWICE A DAY AND PRN 100 each 6  . metFORMIN (GLUCOPHAGE) 500 MG tablet Take 1 tablet (500 mg total) by mouth 2 (two) times daily with a meal. 180 tablet 1    . ONE TOUCH LANCETS MISC USE TO CHECK BLOOD SUGAR TWICE A DAY AND PRN 100 each 6   No current facility-administered medications on file prior to visit.    BP 130/80 mmHg  Pulse 81  Temp(Src) 98.3 F (36.8 C) (Oral)  Resp 20  Ht 5\' 7"  (1.702 m)  Wt 244 lb (110.678 kg)  BMI 38.21 kg/m2  SpO2 96%      Review of Systems  Skin: Positive for rash.       Objective:   Physical Exam  Constitutional: He appears well-developed and well-nourished. No distress.  Skin:  Erythematous papular rash involving the lower aspects of both arms primarily inner aspects          Assessment & Plan:   Contact dermatitis.  The patient will benefit from systemic steroids, which may aggravate his diabetes.  Will treat with triamcinolone cream and Benadryl  Diabetes mellitus.  Follow-up endocrinology  Return here when necessary

## 2014-09-28 ENCOUNTER — Ambulatory Visit: Payer: 59 | Admitting: Endocrinology

## 2014-10-26 ENCOUNTER — Ambulatory Visit (INDEPENDENT_AMBULATORY_CARE_PROVIDER_SITE_OTHER): Payer: 59 | Admitting: Endocrinology

## 2014-10-26 VITALS — BP 130/85 | HR 79 | Temp 98.2°F | Resp 16 | Ht 67.0 in | Wt 241.0 lb

## 2014-10-26 DIAGNOSIS — E119 Type 2 diabetes mellitus without complications: Secondary | ICD-10-CM | POA: Diagnosis not present

## 2014-10-26 DIAGNOSIS — I10 Essential (primary) hypertension: Secondary | ICD-10-CM | POA: Diagnosis not present

## 2014-10-26 LAB — POCT GLYCOSYLATED HEMOGLOBIN (HGB A1C): Hemoglobin A1C: 6.8

## 2014-10-26 NOTE — Progress Notes (Signed)
Patient ID: Eric Adams, male   DOB: [birth year], 44 y.o.   MRN: [id number]           Reason for Appointment: Consultation for Type 2 Diabetes  Referring physician: Burnice Logan  History of Present Illness:          Date of diagnosis of type 2 diabetes mellitus:        Background history:   Apparently the patient was being seen for routine physical exam in his blood sugar was found to be 169 random A1c was 8.6 and he was told to have diabetes He was not having any symptoms of increased thirst or urination or fatigue  Recent history:   The patient was started on metformin twice a day along with samples of Jardiance 10 mg daily He was seen in follow-up a couple of weeks later and was told to increase his Jardiance to 25 mg Patient also started making significant changes in his diet with eliminating a lot of carbohydrates, drinks with sugar and higher fat foods.  Also started increasing his intake of vegetables.  He was previously eating relatively large amounts of carbohydrates including rice and also drinking at least 2 servings of drinks with sugar daily  Current blood sugar patterns and problems identified:  He has not been exercising and has not been thinking about doing this recently  He had his wife have not had any diabetes education and came in here since they wanted to consult a specialist.  He is checking his blood sugars fairly consistently before breakfast and dinner and these are generally around 100-120 range   Although he has been tolerating Jardiance without difficulty.  Apparently is taking the metformin only once a day     Oral hypoglycemic drugs the patient is taking are: Jardiance 25 mg daily, metformin 500 mg daily      Side effects from medications have been: None  Compliance with the medical regimen: Good  Glucose monitoring:  done 2 times a day         Glucometer: One Touch.      Blood Glucose readings as above  Self-care: The diet that the patient has  been following is: tries to limit carbohydrates     Meal times: Breakfast: 5 AM  Lunch:11:30 AM  Dinner: 8:30 PM    Typical meal intake: Breakfast is  oatmeal with peanuts               Dietician visit, most recent: never               Exercise:  none.  He has a sedentary job as a Administrator  [employer] history:  Wt Readings from Last 3 Encounters:  10/26/14 241 lb (109.317 kg)  09/13/14 244 lb (110.678 kg)  08/30/14 247 lb (112.038 kg)    Glycemic control:   Lab Results  Component Value Date   HGBA1C 6.8 10/26/2014   HGBA1C 8.6* 08/22/2014   Lab Results  Component Value Date   CREATININE 1.15 08/18/2014         Medication List       This list is accurate as of: 10/26/14  9:44 PM.  Always use your most recent med list.               empagliflozin 25 MG Tabs tablet  Commonly known as:  JARDIANCE  Take 25 mg by mouth daily.     glucose blood test strip  Commonly known as:  ONETOUCH VERIO  USE TO CHECK BLOOD SUGAR TWICE A DAY AND PRN     metFORMIN 500 MG tablet  Commonly known as:  GLUCOPHAGE  Take 1 tablet (500 mg total) by mouth 2 (two) times daily with a meal.     ONE TOUCH LANCETS Misc  USE TO CHECK BLOOD SUGAR TWICE A DAY AND PRN     triamcinolone cream 0.1 %  Commonly known as:  KENALOG  Apply 1 application topically 2 (two) times daily.        Allergies: No Known Allergies  Past Medical History  Diagnosis Date  . Hepatic steatosis   . H. pylori infection     Past Surgical History  Procedure Laterality Date  . Foot surgery Right   . Breath tek h pylori N/A 12/21/2012    Procedure: BREATH TEK H PYLORI;  Surgeon: Jerene Bears, MD;  Location: [facility];  Service: Gastroenterology;  Laterality: N/A;    Family History  Problem Relation Age of Onset  . Colon cancer Neg Hx     Social History:  reports that he has never smoked. He has never used smokeless tobacco. He reports that he drinks alcohol. He reports that he does not use illicit  drugs.    Review of Systems    Lipid history: He has not been on any medications and no other recent lipids available    Lab Results  Component Value Date   CHOL 222* 08/31/2012   HDL 49.10 08/31/2012   LDLDIRECT 150.4 08/31/2012   TRIG 162.0* 08/31/2012   CHOLHDL 5 08/31/2012           Constitutional: no recent weight gain/loss, no complaints of unusual fatigue   Eyes: no history of blurred vision.  No recent eye exam    ENT: no nasal congestion  Cardiovascular: no chest pain or tightness on exertion.  No leg swelling.  Hypertension: His blood pressure has been consistently high but no treatment currently started, has started Jardiance in 7/16  Respiratory: no cough/shortness of breath  Gastrointestinal: no constipation, diarrhea, nausea or abdominal pain  Musculoskeletal: no muscle/joint pain   Urological:   No frequency of urination and usually no nocturia  Skin: no rash or infections  Neurological: no headaches.  Has no numbness, burning, pains or tingling in feet    Psychiatric: no symptoms of depression  Endocrine: No unusual fatigue, cold intolerance or history of thyroid disease   LABS:  Office Visit on 10/26/2014  Component Date Value Ref Range Status  . Hemoglobin A1C 10/26/2014 6.8   Final    Physical Examination:  BP 130/85 mmHg  Pulse 79  Temp(Src) 98.2 F (36.8 C)  Resp 16  Ht 5\' 7"  (1.702 m)  Wt 241 lb (109.317 kg)  BMI 37.74 kg/m2  SpO2 97%  GENERAL:         Patient has generalized obesity.   HEENT:         Eye exam shows normal external appearance. Fundus exam shows no retinopathy. Oral exam shows normal mucosa .  NECK:   There is no lymphadenopathy.  Mild acanthosis present  Thyroid is just palpable on the left side on swallowing, firm, nodular Carotids are normal to palpation and no bruit heard LUNGS:         Chest is symmetrical. Lungs are clear to auscultation.Marland Kitchen   HEART:         Heart sounds:  S1 and S2 are normal. No murmur or  click heard., no S3 or S4.  ABDOMEN:   There is no distention present. Liver and spleen are not palpable. No other mass or tenderness present.   NEUROLOGICAL:   Vibration sense is nearly normal in distal first toes. Ankle jerks are 1+ bilaterally.          Diabetic foot exam shows normal monofilament sensation in the toes and plantar surfaces, no skin lesions or ulcers on the feet and normal pedal pulses MUSCULOSKELETAL:  There is no swelling or deformity of the peripheral joints. Spine is normal to inspection.   EXTREMITIES:     There is no edema. No skin lesions present.Marland Kitchen SKIN:       No rash      ASSESSMENT:  Diabetes type 2,  recently diagnosed  With changing his lifestyle especially diet he had rapid improvement in his blood sugars right after the diagnosis   He probably did benefit somewhat from starting his medications also However currently taking only minimal doses of metformin, 500 mg daily Fasting blood sugars are mildly increased However with an A1c of 6.8% in less than 2 months after starting diabetes management his blood sugars appear to be very well controlled  Probable HYPERTENSION.  Blood pressure is still relatively high today despite taking Jardiance although not clear if he has some degree of office hypertension.  HYPERCHOLESTEROLEMIA: This has not been rechecked for the last 2 years, previous LDL 150  GOITER: He appears to have a small goiter especially in the left side and this has not been evaluated  PLAN:     Increase metformin to at least 500 mg twice a day and consider increasing to 1500 mg to help with insulin resistance.  Given patient information brochure on metformin    Start monitoring blood sugars about 2 hours after meals instead of just before meals and only about 2 or 3 times a week in the morning  Bring blood sugar monitor for download  Start exercise program at least 3-4 days a week   Check lipid panel on the next visit  Check urine microalbumin  on the next visit  Scheduled eye exam  Continue Jardiance for now but consider stopping it and starting an ACE inhibitor on the next visit  Scheduled consultation with dietitian  Reexamine thyroid on the next visit and consider doing ultrasound if still palpable  Patient Instructions  Check blood sugars on waking up .. 3 .. times a week Also check blood sugars about 2 hours after a meal and do this after different meals by rotation  Recommended blood sugar levels on waking up is 90-120 and about 2 hours after meal is 120-160 Please bring blood sugar monitor to each visit.  Metformin 2 daily  Exercise 5 days/week      Shine Mikes 10/26/2014, 9:44 PM   Note: This office note was prepared with Estate agent. Any transcriptional errors that result from this process are unintentional.

## 2014-10-26 NOTE — Patient Instructions (Addendum)
Check blood sugars on waking up .. 3 .. times a week Also check blood sugars about 2 hours after a meal and do this after different meals by rotation  Recommended blood sugar levels on waking up is 90-120 and about 2 hours after meal is 120-160 Please bring blood sugar monitor to each visit.  Metformin 2 daily  Exercise 5 days/week

## 2014-12-05 ENCOUNTER — Other Ambulatory Visit (INDEPENDENT_AMBULATORY_CARE_PROVIDER_SITE_OTHER): Payer: 59

## 2014-12-05 DIAGNOSIS — E119 Type 2 diabetes mellitus without complications: Secondary | ICD-10-CM | POA: Diagnosis not present

## 2014-12-06 LAB — BASIC METABOLIC PANEL
BUN: 14 mg/dL (ref 6–23)
CALCIUM: 9.9 mg/dL (ref 8.4–10.5)
CHLORIDE: 103 meq/L (ref 96–112)
CO2: 25 mEq/L (ref 19–32)
CREATININE: 1.22 mg/dL (ref 0.40–1.50)
GFR: 68.47 mL/min (ref >60.00)
Glucose, Bld: 127 mg/dL — ABNORMAL HIGH (ref 70–99)
Potassium: 3.7 mEq/L (ref 3.5–5.1)
Sodium: 140 mEq/L (ref 135–145)

## 2014-12-06 LAB — LIPID PANEL
CHOL/HDL RATIO: 5
Cholesterol: 209 mg/dL — ABNORMAL HIGH (ref 0–200)
HDL: 38.3 mg/dL — ABNORMAL LOW (ref >39.00)
NONHDL: 170.62
TRIGLYCERIDES: 363 mg/dL — AB (ref 0.0–149.0)
VLDL: 72.6 mg/dL — AB (ref 0.0–40.0)

## 2014-12-06 LAB — LDL CHOLESTEROL, DIRECT: Direct LDL: 115 mg/dL

## 2014-12-06 LAB — FRUCTOSAMINE: FRUCTOSAMINE: 287 umol/L — AB (ref 0–285)

## 2014-12-07 ENCOUNTER — Ambulatory Visit (INDEPENDENT_AMBULATORY_CARE_PROVIDER_SITE_OTHER): Payer: 59 | Admitting: Endocrinology

## 2014-12-07 ENCOUNTER — Encounter: Payer: Self-pay | Admitting: Endocrinology

## 2014-12-07 VITALS — BP 146/82 | HR 182 | Temp 98.0°F | Resp 16 | Ht 67.0 in | Wt 245.8 lb

## 2014-12-07 DIAGNOSIS — E119 Type 2 diabetes mellitus without complications: Secondary | ICD-10-CM

## 2014-12-07 DIAGNOSIS — I10 Essential (primary) hypertension: Secondary | ICD-10-CM

## 2014-12-07 DIAGNOSIS — E049 Nontoxic goiter, unspecified: Secondary | ICD-10-CM

## 2014-12-07 LAB — MICROALBUMIN / CREATININE URINE RATIO
CREATININE, U: 102.1 mg/dL
Microalb Creat Ratio: 0.7 mg/g (ref 0.0–30.0)

## 2014-12-07 MED ORDER — RAMIPRIL 5 MG PO CAPS: 5.0000 mg | ORAL_CAPSULE | Freq: Every day | ORAL | Status: DC

## 2014-12-07 MED ORDER — METFORMIN HCL 1000 MG PO TABS: ORAL_TABLET | ORAL | Status: DC

## 2014-12-07 NOTE — Patient Instructions (Signed)
Check blood sugars on waking up .. 2 .. times a week Also check blood sugars about 2 hours after a meal and do this after different meals by rotation  Recommended blood sugar levels on waking up is 90-120 and about 2 hours after meal is 120-160 Please bring blood sugar monitor to each visit.  Walk or exercise!  Take 1 Metformin in am and 2 at dinner

## 2014-12-07 NOTE — Progress Notes (Signed)
Patient ID: Eric Adams, male   DOB: 03-06-1970, 44 y.o.   MRN: [id number]           Reason for Appointment: Follow-up for Type 2 Diabetes  Referring physician: Burnice Logan  History of Present Illness:          Date of diagnosis of type 2 diabetes mellitus:        Background history:   Apparently the patient was being seen for routine physical exam in his blood sugar was found to be 169 random A1c was 8.6 and he was told to have diabetes He was not having any symptoms of increased thirst or urination or fatigue  Recent history:  Off mon jard  The patient was on a regimen of Jardiance metformin when he was first seen However he was taking only 500 mg of metformin daily Blood sugars at home were reportedly fairly good and his A1c had come down to 6.8  He was told to increase his metformin to 3 tablets a day but is taking only 2; also occasionally has forgotten the medication He ran out of his Jardiance on Monday but blood sugars since then are still fairly good He has been advised to see the dietitian but still waiting for his appointment. Also he was asked to start exercise regimen but he has not done any  Current blood sugar patterns and problems identified:  He thinks his blood sugars are near normal but is checking before breakfast and supper only, forgot his meter again today  His weight has gone up slightly  Lab glucose was 127 in the afternoon  No side effects with metformin twice a day  Oral hypoglycemic drugs the patient is taking are: Jardiance 25 mg daily, metformin 500 mg 2x daily      Side effects from medications have been: None  Compliance with the medical regimen: Fair  Glucose monitoring:  done 2 times a day         Glucometer: One Touch.      Blood Glucose readings 115-129 ac bid  Self-care: The diet that the patient has been following is: tries to limit carbohydrates     Meal times: Breakfast: 5 AM  Lunch:11:30 AM  Dinner: 8:30 PM    Typical meal  intake: Breakfast is  oatmeal with peanuts               Dietician visit, most recent: never               Exercise:  rare.  He has a sedentary job as a Administrator  [employer] history:  Wt Readings from Last 3 Encounters:  12/07/14 245 lb 12.8 oz (111.494 kg)  10/26/14 241 lb (109.317 kg)  09/13/14 244 lb (110.678 kg)    Glycemic control:   Lab Results  Component Value Date   HGBA1C 6.8 10/26/2014   HGBA1C 8.6* 08/22/2014   Lab Results  Component Value Date   MICROALBUR <0.7 12/05/2014   CREATININE 1.22 12/05/2014         Medication List       This list is accurate as of: 12/07/14  9:10 PM.  Always use your most recent med list.               glucose blood test strip  Commonly known as:  ONETOUCH VERIO  USE TO CHECK BLOOD SUGAR TWICE A DAY AND PRN     metFORMIN 1000 MG tablet  Commonly known as:  GLUCOPHAGE  1/2  am and 1 pcs     ONE TOUCH LANCETS Misc  USE TO CHECK BLOOD SUGAR TWICE A DAY AND PRN     ramipril 5 MG capsule  Commonly known as:  ALTACE  Take 1 capsule (5 mg total) by mouth daily.     triamcinolone cream 0.1 %  Commonly known as:  KENALOG  Apply 1 application topically 2 (two) times daily.        Allergies: No Known Allergies  Past Medical History  Diagnosis Date  . Hepatic steatosis   . H. pylori infection     Past Surgical History  Procedure Laterality Date  . Foot surgery Right   . Breath tek h pylori N/A 12/21/2012    Procedure: BREATH TEK H PYLORI;  Surgeon: Jerene Bears, MD;  Location: [facility];  Service: Gastroenterology;  Laterality: N/A;    Family History  Problem Relation Age of Onset  . Colon cancer Neg Hx     Social History:  reports that he has never smoked. He has never used smokeless tobacco. He reports that he drinks alcohol. He reports that he does not use illicit drugs.    Review of Systems    Lipid history: He has not been on any medications and no other recent lipids available    Lab Results    Component Value Date   CHOL 209* 12/05/2014   HDL 38.30* 12/05/2014   LDLDIRECT 115.0 12/05/2014   TRIG 363.0* 12/05/2014   CHOLHDL 5 12/05/2014           Constitutional: no recent weight gain/loss, no complaints of unusual fatigue   Eyes: no history of blurred vision.  No recent eye exam    ENT: no nasal congestion  Cardiovascular: no chest pain or tightness on exertion.  No leg swelling.  Hypertension: His blood pressure has been consistently high but no treatment currently started, has started Jardiance in 7/16  Respiratory: no cough/shortness of breath  Gastrointestinal: no constipation, diarrhea, nausea or abdominal pain  Musculoskeletal: no muscle/joint pain   Urological:   No frequency of urination and usually no nocturia  Skin: no rash or infections  Neurological: no headaches.  Has no numbness, burning, pains or tingling in feet    Psychiatric: no symptoms of depression  Endocrine: No unusual fatigue, cold intolerance or history of thyroid disease   LABS:  Lab on 12/05/2014  Component Date Value Ref Range Status  . Fructosamine 12/05/2014 287* 0 - 285 umol/L Final   Comment: Published reference interval for apparently healthy subjects between age 15 and 42 is 83 - 285 umol/L and in a poorly controlled diabetic population is 228 - 563 umol/L with a mean of 396 umol/L.   Marland Kitchen Cholesterol 12/05/2014 209* 0 - 200 mg/dL Final   ATP III Classification       Desirable:  < 200 mg/dL               Borderline High:  200 - 239 mg/dL          High:  > = 240 mg/dL  . Triglycerides 12/05/2014 363.0* 0.0 - 149.0 mg/dL Final   Normal:  <150 mg/dLBorderline High:  150 - 199 mg/dL  . HDL 12/05/2014 38.30* >39.00 mg/dL Final  . VLDL 12/05/2014 72.6* 0.0 - 40.0 mg/dL Final  . Total CHOL/HDL Ratio 12/05/2014 5   Final                  Men  Women1/2 Average Risk     3.4          3.3Average Risk          5.0          4.42X Average Risk          9.6          7.13X Average  Risk          15.0          11.0                      . NonHDL 12/05/2014 170.62   Final   NOTE:  Non-HDL goal should be 30 mg/dL higher than patient's LDL goal (i.e. LDL goal of < 70 mg/dL, would have non-HDL goal of < 100 mg/dL)  . Sodium 12/05/2014 140  135 - 145 mEq/L Final  . Potassium 12/05/2014 3.7  3.5 - 5.1 mEq/L Final  . Chloride 12/05/2014 103  96 - 112 mEq/L Final  . CO2 12/05/2014 25  19 - 32 mEq/L Final  . Glucose, Bld 12/05/2014 127* 70 - 99 mg/dL Final  . BUN 12/05/2014 14  6 - 23 mg/dL Final  . Creatinine, Ser 12/05/2014 1.22  0.40 - 1.50 mg/dL Final  . Calcium 12/05/2014 9.9  8.4 - 10.5 mg/dL Final  . GFR 12/05/2014 68.47  >60.00 mL/min Final  . Microalb, Ur 12/05/2014 <0.7  0.0 - 1.9 mg/dL Final  . Creatinine,U 12/05/2014 102.1   Final  . Microalb Creat Ratio 12/05/2014 0.7  0.0 - 30.0 mg/g Final  . Direct LDL 12/05/2014 115.0   Final   Optimal:  <100 mg/dLNear or Above Optimal:  100-129 mg/dLBorderline High:  130-159 mg/dLHigh:  160-189 mg/dLVery High:  >190 mg/dL    Physical Examination:  BP 146/82 mmHg  Pulse 182  Temp(Src) 98 F (36.7 C)  Resp 16  Ht 5\' 7"  (1.702 m)  Wt 245 lb 12.8 oz (111.494 kg)  BMI 38.49 kg/m2  SpO2 94%  Repeat blood pressure with large cuff 122/86 left side  His thyroid is palpable about 1-1/2-2 times normal on the left side and just palpable on the right, firm and slightly nodular  ASSESSMENT:  Diabetes type 2, mildly obese He has fairly good blood sugar control, currently on a regimen of metformin 500 twice a day and Jardiance However even though his readings before meals at home are nearly normal his fructosamine is slightly higher than normal indicating possible postprandial hyperglycemia He does need to be exercising and emphasized the importance of doing this He has not had his consultation with the dietitian and will be seeing her tomorrow  Probable HYPERTENSION.  Blood pressure is still relatively high today, has been off  his Jardiance since Monday  HYPERCHOLESTEROLEMIA: Mildly increased LDL, nonfasting triglycerides are high  GOITER: He appears to have a small goiter especially in the left side and this is slightly nodular, her last TSH normal  PLAN:     Increase metformin to 1500 mg daily, patient instructions given  Leave off Jardiance for now but call if blood sugars started going up  Start regular exercise program.  He wants to join the gym  Regular glucose monitoring including some readings 2 hours after eating  Review meal planning with dietitian tomorrow  Thyroid ultrasound  Ramipril 5 mg daily, discussed benefits of ACE inhibitor medications  Thyroid ultrasound  Fasting lipids to be checked and consider statin drug if levels are consistently abnormal,  meanwhile he may be able to improve his diet and lose some weight  Patient Instructions  Check blood sugars on waking up .. 2 .. times a week Also check blood sugars about 2 hours after a meal and do this after different meals by rotation  Recommended blood sugar levels on waking up is 90-120 and about 2 hours after meal is 120-160 Please bring blood sugar monitor to each visit.  Walk or exercise!  Take 1 Metformin in am and 2 at dinner      influenza vaccine given  Counseling time on subjects discussed above is over 50% of today's 25 minute visit   Quintan Saldivar 12/07/2014, 9:10 PM   Note: This office note was prepared with Estate agent. Any transcriptional errors that result from this process are unintentional.

## 2014-12-08 ENCOUNTER — Encounter: Payer: 59 | Attending: Endocrinology | Admitting: Dietician

## 2014-12-08 ENCOUNTER — Encounter: Payer: Self-pay | Admitting: Dietician

## 2014-12-08 VITALS — Ht 66.0 in | Wt 240.0 lb

## 2014-12-08 DIAGNOSIS — Z713 Dietary counseling and surveillance: Secondary | ICD-10-CM | POA: Insufficient documentation

## 2014-12-08 DIAGNOSIS — E119 Type 2 diabetes mellitus without complications: Secondary | ICD-10-CM | POA: Diagnosis not present

## 2014-12-08 NOTE — Patient Instructions (Signed)
Start an exercise habit.  30-60 minutes most days of the week. Great job with the changes that you have made (more non starchy vegetables/salads, more water and eliminating sugar beverages, watching portion size and meal schedule).  Keep up these good habits. Be careful of portion sizes of starch foods such as rice and fufu. Consider having your 12:00 meal at 2:00. Aim for 4 Carb Choices per meal (60 grams) +/- 1 either way  Aim for 0-2 Carbs per snack if hungry  Include protein in moderation with your meals and snacks Consider reading food labels for Total Carbohydrate and Fat Grams of foods Consider checking BG at alternate times per day as directed by MD  Consider taking medication as directed by MD

## 2014-12-08 NOTE — Progress Notes (Signed)
Diabetes Self-Management Education  Visit Type: First/Initial  Appt. Start Time: 1600 Appt. End Time: 2947  12/08/2014  Mr. Eric Adams, identified by name and date of birth, is a 44 y.o. male with a diagnosis of Diabetes: Type 2.   Patient is here with his wife.  She does the shopping and cooking even cooking ahead for him when she has to travel for work.  Her father died from diabetes complications when she was in high school.  Patient drives a truck locally.  Other hx includes HTN and high triglycerides.  He weighed 145 lbs about 25 years ago.  Build is stocky and large boned.  We discussed benefits of losing 20 lbs and reevaluating.  Most of his carbohydrate intake is from 5 am to noon.  He has made several changes in his diet and A1C has decreased from 8.6% to 6.8% in the past 2 months.  He has not begun exercising but states that he needs to use the gym at work prior to coming home in the evening.  ASSESSMENT  Height 5\' 6"  (1.676 m), weight 240 lb (108.863 kg). Body mass index is 38.76 kg/(m^2).      Diabetes Self-Management Education - 12/08/14 1727    Visit Information   Visit Type First/Initial   Initial Visit   Diabetes Type Type 2   Are you currently following a meal plan? Yes   Date Diagnosed 6 months ago   Health Coping   How would you rate your overall health? Good   Psychosocial Assessment   Patient Belief/Attitude about Diabetes Motivated to manage diabetes   Self-care barriers None   Self-management support Doctor's office;Family   Other persons present Patient;Spouse/SO   Patient Concerns Nutrition/Meal planning;Glycemic Control   Special Needs None   Preferred Learning Style Auditory   Learning Readiness Ready   How often do you need to have someone help you when you read instructions, pamphlets, or other written materials from your doctor or pharmacy? 1 - Never   What is the last grade level you completed in school? finished college in [place]    Complications   Last HgB A1C per patient/outside source 6.8 %  10/26/14   How often do you check your blood sugar? 1-2 times/day   Fasting Blood glucose range (mg/dL) 70-129   Postprandial Blood glucose range (mg/dL) 130-179   Number of hypoglycemic episodes per month 0   Number of hyperglycemic episodes per week 0   Have you had a dilated eye exam in the past 12 months? No  It is on the list   Have you had a dental exam in the past 12 months? No  It is on the list.   Are you checking your feet? No   Dietary Intake   Breakfast 2 plain instant oatmeal with 1 tsp agave and peanuts   Snack (morning) salad with boiled egg, green smoothie( greens, banana, apple, strawberries, ground flax seeds, setvia, water)   Lunch 1 1/2 cups fried rice with chicken, lamb, or fish, salad or non starchy vegetables.   Snack (afternoon) fiber one bar and/or P3 pack   Dinner simular to lunch or spaghetti with [employer] pasta, skips dinner when wife is traveling because his is not hungry   Snack (evening) none now (stopped cereal and almond milk)   Beverage(s) water   Exercise   Exercise Type ADL's   How many days per week to you exercise? 0   How many minutes per day do you exercise?  0   Total minutes per week of exercise 0   Patient Education   Previous Diabetes Education No   Disease state  Factors that contribute to the development of diabetes   Nutrition management  Role of diet in the treatment of diabetes and the relationship between the three main macronutrients and blood glucose level;Food label reading, portion sizes and measuring food.;Meal options for control of blood glucose level and chronic complications.   Physical activity and exercise  Role of exercise on diabetes management, blood pressure control and cardiac health.   Monitoring Identified appropriate SMBG and/or A1C goals.   Psychosocial adjustment Worked with patient to identify barriers to care and solutions   Personal strategies to promote  health Lifestyle issues that need to be addressed for better diabetes care   Individualized Goals (developed by patient)   Nutrition General guidelines for healthy choices and portions discussed   Physical Activity Exercise 3-5 times per week;45 minutes per day   Medications take my medication as prescribed   Monitoring  test my blood glucose as discussed   Reducing Risk increase portions of nuts and seeds;increase portions of healthy fats   Outcomes   Expected Outcomes Demonstrated interest in learning. Expect positive outcomes   Future DMSE PRN   Program Status Completed      Individualized Plan for Diabetes Self-Management Training:   Learning Objective:  Patient will have a greater understanding of diabetes self-management. Patient education plan is to attend individual and/or group sessions per assessed needs and concerns.   Plan:   Patient Instructions  Start an exercise habit.  30-60 minutes most days of the week. Great job with the changes that you have made (more non starchy vegetables/salads, more water and eliminating sugar beverages, watching portion size and meal schedule).  Keep up these good habits. Be careful of portion sizes of starch foods such as rice and fufu. Consider having your 12:00 meal at 2:00. Aim for 4 Carb Choices per meal (60 grams) +/- 1 either way  Aim for 0-2 Carbs per snack if hungry  Include protein in moderation with your meals and snacks Consider reading food labels for Total Carbohydrate and Fat Grams of foods Consider checking BG at alternate times per day as directed by MD  Consider taking medication as directed by MD    Expected Outcomes:  Demonstrated interest in learning. Expect positive outcomes  Education material provided: Food label handouts, A1C conversion sheet, Meal plan card, My Plate and Snack sheet  If problems or questions, patient to contact team via:  Phone and Email  Future DSME appointment: PRN

## 2015-01-24 ENCOUNTER — Telehealth: Payer: Self-pay | Admitting: Endocrinology

## 2015-01-24 NOTE — Telephone Encounter (Signed)
Husband is having skin issues bumps with itching and fever to the touch and diarrhea, could this be attributed to diabetes. The pt is not taking the metformin as prescribed.

## 2015-01-24 NOTE — Telephone Encounter (Signed)
Please see below and advise.

## 2015-01-24 NOTE — Telephone Encounter (Signed)
Called and spoke with pt. Advised him per Dr Ronnie Derby message below. Pt voiced understanding.

## 2015-01-24 NOTE — Telephone Encounter (Signed)
Not related to diabetes, needs to be: PCP

## 2015-01-24 NOTE — Telephone Encounter (Signed)
Patient ask you to call her on her  phone # [phone]

## 2015-02-06 ENCOUNTER — Ambulatory Visit (INDEPENDENT_AMBULATORY_CARE_PROVIDER_SITE_OTHER): Payer: 59 | Admitting: Endocrinology

## 2015-02-06 ENCOUNTER — Encounter: Payer: Self-pay | Admitting: Endocrinology

## 2015-02-06 VITALS — BP 118/70 | HR 83 | Temp 98.5°F | Resp 16 | Ht 67.0 in | Wt 243.6 lb

## 2015-02-06 DIAGNOSIS — E785 Hyperlipidemia, unspecified: Secondary | ICD-10-CM | POA: Diagnosis not present

## 2015-02-06 DIAGNOSIS — E119 Type 2 diabetes mellitus without complications: Secondary | ICD-10-CM

## 2015-02-06 DIAGNOSIS — E049 Nontoxic goiter, unspecified: Secondary | ICD-10-CM | POA: Diagnosis not present

## 2015-02-06 DIAGNOSIS — M7989 Other specified soft tissue disorders: Secondary | ICD-10-CM | POA: Diagnosis not present

## 2015-02-06 LAB — POCT GLYCOSYLATED HEMOGLOBIN (HGB A1C): Hemoglobin A1C: 6.7

## 2015-02-06 NOTE — Progress Notes (Signed)
Patient ID: Eric Adams, male   DOB: 1970/12/11, 44 y.o.   MRN: [id number]           Reason for Appointment: Follow-up for Type 2 Diabetes  Referring physician: Burnice Logan  History of Present Illness:          Date of diagnosis of type 2 diabetes mellitus:        Background history:   Apparently the patient was being seen for routine physical exam in his blood sugar was found to be 169 random A1c was 8.6 and he was told to have diabetes He was not having any symptoms of increased thirst or urination or fatigue  Recent history:   The patient was on a regimen of Jardiance and metformin 500 mg when he was first seen  He was told to increase his metformin to 3 tablets a day and is now taking 1500 mg without side effects. Since his blood sugars appear to be fairly good with his leaving off Jardiance this was not continued on his last visit He has seen the dietitian also Also he was asked to start exercise regimen but he has not done any  Current blood sugar patterns and problems identified:  He has had fairly good blood sugars at home although had some readings in the 140s about 3 weeks ago when he stopped his medication when he was having a rash  He has not done any readings after supper but readings before supper are usually fairly good  He has gained a little weight  Oral hypoglycemic drugs the patient is taking are: metformin 500 mg a.m., 1000 mg p.m. Side effects from medications have been: None  Compliance with the medical regimen: Fair  Glucose monitoring:  done 2 times a day         Glucometer: One Touch.      Blood Glucose readings   Mean values apply above for all meters except median for One Touch  PRE-MEAL Fasting Lunch Dinner Bedtime Overall  Glucose range:  105-145   97-118   91-146   167    Mean/median:  124    109    118      Self-care: The diet that the patient has been following is: tries to limit carbohydrates     Meal times: Breakfast: 5 AM   Lunch:11:30 AM  Dinner: 8:30 PM    Typical meal intake: Breakfast is oatmeal with peanuts               Dietician visit, most recent: never               Exercise: At Gym 45 min, walks, weights  Weight history:  Wt Readings from Last 3 Encounters:  02/06/15 243 lb 9.6 oz (110.496 kg)  12/08/14 240 lb (108.863 kg)  12/07/14 245 lb 12.8 oz (111.494 kg)    Glycemic control:   Lab Results  Component Value Date   HGBA1C 6.7 02/06/2015   HGBA1C 6.8 10/26/2014   HGBA1C 8.6* 08/22/2014   Lab Results  Component Value Date   MICROALBUR <0.7 12/05/2014   CREATININE 1.22 12/05/2014    OTHER problems: Addressed in the review of systems     Medication List       This list is accurate as of: 02/06/15 11:59 PM.  Always use your most recent med list.               glucose blood test strip  Commonly known as:  ONETOUCH  VERIO  USE TO CHECK BLOOD SUGAR TWICE A DAY AND PRN     metFORMIN 1000 MG tablet  Commonly known as:  GLUCOPHAGE  1/2 am and 1 pcs     ONE TOUCH LANCETS Misc  USE TO CHECK BLOOD SUGAR TWICE A DAY AND PRN     ramipril 5 MG capsule  Commonly known as:  ALTACE  Take 1 capsule (5 mg total) by mouth daily.        Allergies: No Known Allergies  Past Medical History  Diagnosis Date  . Hepatic steatosis   . H. pylori infection   . Diabetes mellitus without complication ([place])   . Hypertension     Past Surgical History  Procedure Laterality Date  . Foot surgery Right   . Breath tek h pylori N/A 12/21/2012    Procedure: BREATH TEK H PYLORI;  Surgeon: Jerene Bears, MD;  Location: [facility];  Service: Gastroenterology;  Laterality: N/A;    Family History  Problem Relation Age of Onset  . Colon cancer Neg Hx   . Diabetes Neg Hx   . Heart disease Neg Hx     Social History:  reports that he has never smoked. He has never used smokeless tobacco. He reports that he drinks alcohol. He reports that he does not use illicit drugs.    Review of Systems      Lipid history: He has not been on any medications , has high triglycerides which were nonfasting     Lab Results  Component Value Date   CHOL 209* 12/05/2014   HDL 38.30* 12/05/2014   LDLDIRECT 115.0 12/05/2014   TRIG 363.0* 12/05/2014   CHOLHDL 5 12/05/2014           Hypertension: His blood pressure has been consistently high and he is now taking ramipril 5 mg daily without side effects   His wife who is an oncology PA thinks that he has gained a lot of muscle mass and she also thinks his ring size is larger, she thinks he needs to be screened for acromegaly.  He does not report any change in his shoes size He has some joint pains but this is mostly on cold exposure  Physical Examination:  BP 118/70 mmHg  Pulse 83  Temp(Src) 98.5 F (36.9 C) (Oral)  Resp 16  Ht 5\' 7"  (1.702 m)  Wt 243 lb 9.6 oz (110.496 kg)  BMI 38.14 kg/m2  SpO2 94%   Facial appearance does not suggest acromegaly Mild swelling of hands but no enlarged bones No peripheral edema  His thyroid is palpable about 1-1/2-2 times normal on the left side and just palpable on the right, firm and slightly nodular  ASSESSMENT:  Diabetes type 2, mildly obese He has fairly good blood sugar control, currently on a regimen of metformin 1500 daily A1c is 6.8. He may have some high readings after meals but he is not checking these He does need to exercise and emphasized the need to start doing this, he will try to join a gym   HYPERTENSION.  Blood pressure is better with ramipril  HYPERCHOLESTEROLEMIA: Mildly increased LDL, nonfasting triglycerides are high and the need to repeat levels, he has been instructed on low fat diet by dietitian in October  GOITER: He appears to have a small goiter especially in the left side and this is slightly nodular, last TSH normal  ?  Acromegaly, he has reportedly Increased ring size, nonspecific joint pains and weight gain.  PLAN:  Increase exercise  IGF-I  level  Continue ramipril  Check thyroid ultrasound  Consider starting medications for lipids if consistently high  Patient Instructions  Check blood sugars on waking up 2-3  times a week Also check blood sugars about 2 hours after a meal and do this after different meals by rotation  Recommended blood sugar levels on waking up is 90-130 and about 2 hours after meal is 130-160  Please bring your blood sugar monitor to each visit, thank you        [facility] 02/07/2015, 10:36 AM   Note: This office note was prepared with Dragon voice recognition system technology. Any transcriptional errors that result from this process are unintentional.

## 2015-02-06 NOTE — Patient Instructions (Signed)
Check blood sugars on waking up 2-3 times a week Also check blood sugars about 2 hours after a meal and do this after different meals by rotation  Recommended blood sugar levels on waking up is 90-130 and about 2 hours after meal is 130-160  Please bring your blood sugar monitor to each visit, thank you  

## 2015-02-07 LAB — LIPID PANEL
CHOL/HDL RATIO: 5
CHOLESTEROL: 200 mg/dL (ref 0–200)
HDL: 40.6 mg/dL (ref >39.00)
NonHDL: 159.48
Triglycerides: 299 mg/dL — ABNORMAL HIGH (ref 0.0–149.0)
VLDL: 59.8 mg/dL — AB (ref 0.0–40.0)

## 2015-02-07 LAB — BASIC METABOLIC PANEL
BUN: 11 mg/dL (ref 6–23)
CALCIUM: 9.5 mg/dL (ref 8.4–10.5)
CO2: 29 mEq/L (ref 19–32)
CREATININE: 1.17 mg/dL (ref 0.40–1.50)
Chloride: 101 mEq/L (ref 96–112)
GFR: 71.8 mL/min (ref >60.00)
Glucose, Bld: 106 mg/dL — ABNORMAL HIGH (ref 70–99)
POTASSIUM: 4 meq/L (ref 3.5–5.1)
Sodium: 138 mEq/L (ref 135–145)

## 2015-02-07 LAB — LDL CHOLESTEROL, DIRECT: LDL DIRECT: 114 mg/dL

## 2015-02-08 LAB — INSULIN-LIKE GROWTH FACTOR: INSULIN LIKE GF 1: 156 ng/mL (ref 75–216)

## 2015-02-10 NOTE — Progress Notes (Signed)
Quick Note:  Please let his wife know that IGF-I test is normal, cholesterol still too high, start pravastatin 40 mg daily ______

## 2015-02-14 ENCOUNTER — Other Ambulatory Visit: Payer: Self-pay | Admitting: *Deleted

## 2015-02-14 ENCOUNTER — Telehealth: Payer: Self-pay | Admitting: Endocrinology

## 2015-02-14 ENCOUNTER — Ambulatory Visit
Admission: RE | Admit: 2015-02-14 | Discharge: 2015-02-14 | Disposition: A | Discharge status: Home / Self Care | Payer: 59 | Source: Ambulatory Visit | Attending: Endocrinology | Admitting: Endocrinology

## 2015-02-14 DIAGNOSIS — E049 Nontoxic goiter, unspecified: Secondary | ICD-10-CM

## 2015-02-14 MED ORDER — PRAVASTATIN SODIUM 40 MG PO TABS: 40.0000 mg | ORAL_TABLET | Freq: Every day | ORAL | Status: DC

## 2015-02-14 NOTE — Telephone Encounter (Signed)
Information left with patients wife, his appt is at 2:45 today at [place]

## 2015-02-14 NOTE — Telephone Encounter (Signed)
Patient wife called stated patient has an ultrasound today, but don't know where, please advise

## 2015-02-14 NOTE — Progress Notes (Signed)
Quick Note:  Please let patient know that the ultrasound shows only mild thickening of the thyroid but no abnormal areas of concern ______

## 2015-03-27 LAB — HM DIABETES EYE EXAM

## 2015-04-17 ENCOUNTER — Encounter: Payer: Self-pay | Admitting: Internal Medicine

## 2015-05-03 ENCOUNTER — Other Ambulatory Visit (INDEPENDENT_AMBULATORY_CARE_PROVIDER_SITE_OTHER): Payer: 59

## 2015-05-03 DIAGNOSIS — E119 Type 2 diabetes mellitus without complications: Secondary | ICD-10-CM | POA: Diagnosis not present

## 2015-05-04 LAB — COMPREHENSIVE METABOLIC PANEL
ALT: 29 U/L (ref 0–53)
AST: 19 U/L (ref 0–37)
Albumin: 4.4 g/dL (ref 3.5–5.2)
Alkaline Phosphatase: 97 U/L (ref 39–117)
BUN: 15 mg/dL (ref 6–23)
CO2: 30 meq/L (ref 19–32)
Calcium: 9.7 mg/dL (ref 8.4–10.5)
Chloride: 102 mEq/L (ref 96–112)
Creatinine, Ser: 1.11 mg/dL (ref 0.40–1.50)
GFR: 76.22 mL/min (ref >60.00)
GLUCOSE: 105 mg/dL — AB (ref 70–99)
POTASSIUM: 4.1 meq/L (ref 3.5–5.1)
SODIUM: 139 meq/L (ref 135–145)
Total Bilirubin: 0.8 mg/dL (ref 0.2–1.2)
Total Protein: 8.2 g/dL (ref 6.0–8.3)

## 2015-05-04 LAB — MICROALBUMIN / CREATININE URINE RATIO
Creatinine,U: 188.7 mg/dL
MICROALB/CREAT RATIO: 0.5 mg/g (ref 0.0–30.0)
Microalb, Ur: 0.9 mg/dL (ref 0.0–1.9)

## 2015-05-04 LAB — HEMOGLOBIN A1C: HEMOGLOBIN A1C: 6.3 % (ref 4.6–6.5)

## 2015-05-08 ENCOUNTER — Ambulatory Visit (INDEPENDENT_AMBULATORY_CARE_PROVIDER_SITE_OTHER): Payer: 59 | Admitting: Endocrinology

## 2015-05-08 ENCOUNTER — Encounter: Payer: Self-pay | Admitting: Endocrinology

## 2015-05-08 VITALS — BP 124/78 | HR 80 | Temp 98.3°F | Resp 16 | Ht 67.0 in | Wt 245.6 lb

## 2015-05-08 DIAGNOSIS — E119 Type 2 diabetes mellitus without complications: Secondary | ICD-10-CM

## 2015-05-08 DIAGNOSIS — E785 Hyperlipidemia, unspecified: Secondary | ICD-10-CM

## 2015-05-08 NOTE — Patient Instructions (Signed)
Check blood sugars on waking up 2  times a week  Also check blood sugars about 2 hours after a meal and do this after different meals by rotation  Recommended blood sugar levels on waking up is 90-130 and about 2 hours after meal is 130-160  Please bring your blood sugar monitor to each visit, thank you  

## 2015-05-08 NOTE — Progress Notes (Signed)
Patient ID: Eric Adams, male   DOB: October 30, 1970, 45 y.o.   MRN: [id number]           Reason for Appointment: Follow-up for Type 2 Diabetes  Referring physician: Burnice Logan  History of Present Illness:          Date of diagnosis of type 2 diabetes mellitus:        Background history:   Apparently the patient was being seen for routine physical exam in his blood sugar was found to be 169 random A1c was 8.6 and he was told to have diabetes He was not having any symptoms of increased thirst or urination or fatigue  Recent history:   The patient was on a regimen of Jardiance and metformin 500 mg when he was first seen He was told to increase his metformin to 3 tablets a day and is taking 1500 mg without side effects. Jardiance was stopped.  He has seen the dietitian and more recently has started exercising 2-3 times a week However has not been a per to lose any weight  Current blood sugar patterns and problems identified:  He has had mostly good blood sugars at home although has an occasional blood sugar of about 150 in the morning which he thinks is when he is not able to sleep well at night; overall median blood sugars better in the morning on this visit  His evening readings are generally near normal  He has been compliant with his metformin.  Oral hypoglycemic drugs the patient is taking are: metformin 500 mg a.m., 1000 mg p.m. Side effects from medications have been: None  Compliance with the medical regimen: Fair  Glucose monitoring:  done 2 times a day         Glucometer: One Touch.      Blood Glucose readings   Mean values apply above for all meters except median for One Touch  PRE-MEAL Fasting Lunch Dinner  PCS  Overall  Glucose range:  95-155    97-151   96-135    Mean/median:  109      113     Self-care: The diet that the patient has been following is: tries to limit carbohydrates     Meal times: Breakfast: 5 AM  Lunch:11:30 AM  Dinner: 8:30 PM    Typical  meal intake: Breakfast is oatmeal with peanuts               Dietician visit, most recent: never               Exercise: At Gym 45 min, walks,runs,  Weights; 2-3 times a week now  Weight history:  Wt Readings from Last 3 Encounters:  05/08/15 245 lb 9.6 oz (111.403 kg)  02/06/15 243 lb 9.6 oz (110.496 kg)  12/08/14 240 lb (108.863 kg)    Glycemic control:   Lab Results  Component Value Date   HGBA1C 6.3 05/03/2015   HGBA1C 6.7 02/06/2015   HGBA1C 6.8 10/26/2014   Lab Results  Component Value Date   MICROALBUR 0.9 05/03/2015   CREATININE 1.11 05/03/2015    OTHER problems: Addressed in the review of systems     Medication List       This list is accurate as of: 05/08/15 11:59 PM.  Always use your most recent med list.               glucose blood test strip  Commonly known as:  ONETOUCH VERIO  USE TO CHECK  BLOOD SUGAR TWICE A DAY AND PRN     metFORMIN 1000 MG tablet  Commonly known as:  GLUCOPHAGE  1/2 am and 1 pcs     ONE TOUCH LANCETS Misc  USE TO CHECK BLOOD SUGAR TWICE A DAY AND PRN     pravastatin 40 MG tablet  Commonly known as:  PRAVACHOL  Take 1 tablet (40 mg total) by mouth daily.     ramipril 5 MG capsule  Commonly known as:  ALTACE  Take 1 capsule (5 mg total) by mouth daily.        Allergies: No Known Allergies  Past Medical History  Diagnosis Date  . Hepatic steatosis   . H. pylori infection   . Diabetes mellitus without complication ([place])   . Hypertension     Past Surgical History  Procedure Laterality Date  . Foot surgery Right   . Breath tek h pylori N/A 12/21/2012    Procedure: BREATH TEK H PYLORI;  Surgeon: Jerene Bears, MD;  Location: [facility];  Service: Gastroenterology;  Laterality: N/A;    Family History  Problem Relation Age of Onset  . Colon cancer Neg Hx   . Diabetes Neg Hx   . Heart disease Neg Hx   . Hypertension Neg Hx   . Kidney disease Neg Hx     Social History:  reports that he has never smoked. He  has never used smokeless tobacco. He reports that he drinks alcohol. He reports that he does not use illicit drugs.    Review of Systems    Lipid history: He has not been on any medications , has high triglycerides which were nonfasting  He was told to start pravastatin but he did not pick up the prescription    Lab Results  Component Value Date   CHOL 200 02/06/2015   HDL 40.60 02/06/2015   LDLDIRECT 114.0 02/06/2015   TRIG 299.0* 02/06/2015   CHOLHDL 5 02/06/2015           Hypertension: His blood pressure has been high and he is  taking ramipril 5 mg daily without side effects and good control     Physical Examination:  BP 124/78 mmHg  Pulse 80  Temp(Src) 98.3 F (36.8 C)  Resp 16  Ht 5\' 7"  (1.702 m)  Wt 245 lb 9.6 oz (111.403 kg)  BMI 38.46 kg/m2  SpO2 96%    ASSESSMENT/PLAN  Diabetes type 2, mildly obese He has fairly good blood sugar control, currently on a regimen of metformin 1500 daily A1c is improved at 6.3 This is with metformin alone He may have some high readings occasionally but not postprandially He has started an exercise regimen and encouraged him to increase frequency of this  Follow-up in 4 months with labs    HYPERTENSION.  Blood pressure is controlled with ramipril  HYPERCHOLESTEROLEMIA: Mildly increased LDL, nonfasting triglycerides are high and the need to repeat levels fasting    Patient Instructions  Check blood sugars on waking up 2 times a week  Also check blood sugars about 2 hours after a meal and do this after different meals by rotation   Recommended blood sugar levels on waking up is 90-130 and about 2 hours after meal is 130-160  Please bring your blood sugar monitor to each visit, thank you      [facility] 05/09/2015, 11:17 AM   Note: This office note was prepared with Dragon voice recognition system technology. Any transcriptional errors that result from this process  are unintentional.

## 2015-05-23 ENCOUNTER — Encounter: Payer: Self-pay | Admitting: Internal Medicine

## 2015-05-23 ENCOUNTER — Ambulatory Visit (INDEPENDENT_AMBULATORY_CARE_PROVIDER_SITE_OTHER): Payer: 59 | Admitting: Internal Medicine

## 2015-05-23 VITALS — BP 124/88 | HR 72 | Ht 67.0 in | Wt 248.0 lb

## 2015-05-23 DIAGNOSIS — Z1211 Encounter for screening for malignant neoplasm of colon: Secondary | ICD-10-CM

## 2015-05-23 DIAGNOSIS — R197 Diarrhea, unspecified: Secondary | ICD-10-CM | POA: Diagnosis not present

## 2015-05-23 DIAGNOSIS — Z8619 Personal history of other infectious and parasitic diseases: Secondary | ICD-10-CM

## 2015-05-23 MED ORDER — NA SULFATE-K SULFATE-MG SULF 17.5-3.13-1.6 GM/177ML PO SOLN: ORAL | Status: DC

## 2015-05-23 NOTE — Progress Notes (Signed)
Subjective:    Patient ID: Eric Adams, male    DOB: 1970/12/20, 45 y.o.   MRN: [id number]  HPI [name] is a 45 year old male with a past medical history of H. pylori gastritis status post treatment, fatty liver, diabetes who is seen in follow-up to evaluate loose stools. He is here with his wife. He reports over the last approximate month he is noted loose watery stools associated with urgency. This is not associated with abdominal pain, cramping, blood in the stool or melena. This seems to be a bigger problem when he eats after having a prolonged fast. If he skips breakfast he often has the loose urgent stools after his next meal. When he eats breakfast his stools have been more normal, formed and brown. He has tried Imodium which has helped with the loose stools on the days he needed it. He has not had heartburn, nausea or vomiting. The previous upper abdominal pain he was experiencing has resolved and not returned. He is taking metformin 500 mg in the morning 1000 mg at evening. He has changed his diet in order to try to get glucose under better control. He's working with Dr. Dwyane Dee with endocrinology  Review of Systems As per history of present illness, otherwise negative  Current Medications, Allergies, Past Medical History, Past Surgical History, Family History and Social History were reviewed in [facility] record.     Objective:   Physical Exam BP 124/88 mmHg  Pulse 72  Ht 5\' 7"  (1.702 m)  Wt 248 lb (112.492 kg)  BMI 38.83 kg/m2 Constitutional: Well-developed and well-nourished. No distress. HEENT: Normocephalic and atraumatic. Oropharynx is clear and moist. No oropharyngeal exudate. Conjunctivae are normal.  No scleral icterus. Neck: Neck supple. Trachea midline. Cardiovascular: Normal rate, regular rhythm and intact distal pulses. No M/R/G Pulmonary/chest: Effort normal and breath sounds normal. No wheezing, rales or rhonchi. Abdominal: Soft,  nontender, nondistended. Bowel sounds active throughout. There are no masses palpable. No hepatosplenomegaly. Extremities: no clubbing, cyanosis, or edema Lymphadenopathy: No cervical adenopathy noted. Neurological: Alert and oriented to person place and time. Skin: Skin is warm and dry. No rashes noted. Psychiatric: Normal mood and affect. Behavior is normal.  CBC    Component Value Date/Time   WBC 5.5 08/18/2014 1640   RBC 5.53 08/18/2014 1640   HGB 15.4 08/18/2014 1640   HCT 45.0 08/18/2014 1640   PLT 291.0 08/18/2014 1640   MCV 81.4 08/18/2014 1640   MCHC 34.2 08/18/2014 1640   RDW 14.6 08/18/2014 1640   LYMPHSABS 2.8 08/18/2014 1640   MONOABS 0.5 08/18/2014 1640   EOSABS 0.1 08/18/2014 1640   BASOSABS 0.0 08/18/2014 1640    CMP     Component Value Date/Time   NA 139 05/03/2015 1702   K 4.1 05/03/2015 1702   CL 102 05/03/2015 1702   CO2 30 05/03/2015 1702   GLUCOSE 105* 05/03/2015 1702   BUN 15 05/03/2015 1702   CREATININE 1.11 05/03/2015 1702   CALCIUM 9.7 05/03/2015 1702   PROT 8.2 05/03/2015 1702   ALBUMIN 4.4 05/03/2015 1702   AST 19 05/03/2015 1702   ALT 29 05/03/2015 1702   ALKPHOS 97 05/03/2015 1702   BILITOT 0.8 05/03/2015 1702        Assessment & Plan:  45 year old male with a past medical history of H. pylori gastritis status post treatment, fatty liver, diabetes who is seen in follow-up to evaluate loose stools.   1. Loose, urgent stools -- Symptoms are intermittent  and occurring when he feels the breakfast. I'm most suspicious for a metformin related loose stools. It is likely that medication is causing his loose stools when he fails to eat breakfast. I've encouraged him to eat 3 balanced, low carbohydrate meals daily. I've also recommended stool studies for completeness to exclude infection. GI pathogen panel fecal calprotectin and fecal elastase.  2. Colorectal cancer screening -- recommended at age 83 for average risk screening. He will be 45 in 2  months and we will proceed with colonoscopy for screening thereafter. We discussed the risks, benefits and alternatives and he is agreeable to proceed.  3. History of H. Pylori -- we had requested H. pylori stool antigen to confirm eradication of prior infection. This was never collected. It is reordered today.  25 minutes spent with the patient today. Greater than 50% was spent in counseling and coordination of care with the patient

## 2015-05-23 NOTE — Patient Instructions (Signed)
Your physician has requested that you go to the basement for the following lab work before leaving today: GI Pathogen panel, Fecal elastace, calprotectin, H pylori stool antigen.  Take your metformin with food.  We will contact you regarding scheduling a colonoscopy in June.

## 2015-06-05 ENCOUNTER — Telehealth: Payer: Self-pay | Admitting: *Deleted

## 2015-06-05 NOTE — Telephone Encounter (Signed)
I have spoken to patient to advise that we have scheduled him for colonoscopy on 08/03/15 at 9:30 am and is scheduled for previsit on 07/14/15 at 2:30 pm her at [facility]. Patient verbalizes understanding.

## 2015-06-07 ENCOUNTER — Other Ambulatory Visit: Payer: 59

## 2015-06-07 DIAGNOSIS — R197 Diarrhea, unspecified: Secondary | ICD-10-CM

## 2015-06-08 LAB — HELICOBACTER PYLORI  SPECIAL ANTIGEN: H. PYLORI Antigen: NOT DETECTED

## 2015-06-09 LAB — GASTROINTESTINAL PATHOGEN PANEL PCR
C. difficile Tox A/B, PCR: NEGATIVE
CRYPTOSPORIDIUM, PCR: NEGATIVE
Campylobacter, PCR: NEGATIVE
E COLI (ETEC) LT/ST, PCR: NEGATIVE
E COLI 0157, PCR: NEGATIVE
E coli (STEC) stx1/stx2, PCR: NEGATIVE
GIARDIA LAMBLIA, PCR: NEGATIVE
NOROVIRUS, PCR: NEGATIVE
Rotavirus A, PCR: NEGATIVE
Salmonella, PCR: NEGATIVE
Shigella, PCR: NEGATIVE

## 2015-06-13 LAB — CALPROTECTIN

## 2015-06-14 LAB — PANCREATIC ELASTASE, FECAL: Pancreatic Elastase-1, Stool: 303 mcg/g

## 2015-07-17 ENCOUNTER — Telehealth: Payer: Self-pay | Admitting: Internal Medicine

## 2015-07-17 NOTE — Telephone Encounter (Signed)
No need for office visit. Had visit just over 1 month ago. He can come for previsit and colonoscopy as scheduled.

## 2015-07-21 ENCOUNTER — Ambulatory Visit (AMBULATORY_SURGERY_CENTER): Payer: Self-pay

## 2015-07-21 VITALS — Ht 66.0 in | Wt 244.0 lb

## 2015-07-21 DIAGNOSIS — Z1211 Encounter for screening for malignant neoplasm of colon: Secondary | ICD-10-CM

## 2015-07-21 MED ORDER — SUPREP BOWEL PREP KIT 17.5-3.13-1.6 GM/177ML PO SOLN: 1.0000 | Freq: Once | ORAL | Status: DC

## 2015-07-21 NOTE — Progress Notes (Signed)
No allergies to eggs or soy No past problems with anesthesia No home oxyxgen No diet meds  Has email and internet; registered for emmi

## 2015-07-27 HISTORY — PX: COLONOSCOPY: SHX174

## 2015-07-31 ENCOUNTER — Encounter: Payer: Self-pay | Admitting: Internal Medicine

## 2015-08-03 ENCOUNTER — Encounter: Payer: Self-pay | Admitting: Internal Medicine

## 2015-08-03 ENCOUNTER — Ambulatory Visit (AMBULATORY_SURGERY_CENTER): Payer: 59 | Admitting: Internal Medicine

## 2015-08-03 VITALS — BP 149/85 | HR 64 | Temp 98.7°F | Resp 18 | Ht 66.0 in | Wt 244.0 lb

## 2015-08-03 DIAGNOSIS — Z1211 Encounter for screening for malignant neoplasm of colon: Secondary | ICD-10-CM

## 2015-08-03 DIAGNOSIS — D124 Benign neoplasm of descending colon: Secondary | ICD-10-CM

## 2015-08-03 DIAGNOSIS — D123 Benign neoplasm of transverse colon: Secondary | ICD-10-CM

## 2015-08-03 MED ORDER — SODIUM CHLORIDE 0.9 % IV SOLN: 500.0000 mL | INTRAVENOUS | Status: DC

## 2015-08-03 NOTE — Progress Notes (Signed)
Called to room to assist during endoscopic procedure.  Patient ID and intended procedure confirmed with present staff. Received instructions for my participation in the procedure from the performing physician.  

## 2015-08-03 NOTE — Progress Notes (Signed)
Patient and wife waiting to talk with Dr. Hilarie Fredrickson.  They have a few more questions.

## 2015-08-03 NOTE — Patient Instructions (Signed)
Resume previous diet, continue current medications, repeat colonoscopy depending on pathology results.  YOU HAD AN ENDOSCOPIC PROCEDURE TODAY AT [facility]:   Refer to the procedure report that was given to you for any specific questions about what was found during the examination.  If the procedure report does not answer your questions, please call your gastroenterologist to clarify.  If you requested that your care partner not be given the details of your procedure findings, then the procedure report has been included in a sealed envelope for you to review at your convenience later.  YOU SHOULD EXPECT: Some feelings of bloating in the abdomen. Passage of more gas than usual.  Walking can help get rid of the air that was put into your GI tract during the procedure and reduce the bloating. If you had a lower endoscopy (such as a colonoscopy or flexible sigmoidoscopy) you may notice spotting of blood in your stool or on the toilet paper. If you underwent a bowel prep for your procedure, you may not have a normal bowel movement for a few days.  Please Note:  You might notice some irritation and congestion in your nose or some drainage.  This is from the oxygen used during your procedure.  There is no need for concern and it should clear up in a day or so.  SYMPTOMS TO REPORT IMMEDIATELY:   Following lower endoscopy (colonoscopy or flexible sigmoidoscopy):  Excessive amounts of blood in the stool  Significant tenderness or worsening of abdominal pains  Swelling of the abdomen that is new, acute  Fever of 100F or higher   Following upper endoscopy (EGD)  Vomiting of blood or coffee ground material  New chest pain or pain under the shoulder blades  Painful or persistently difficult swallowing  New shortness of breath  Fever of 100F or higher  Black, tarry-looking stools  For urgent or emergent issues, a gastroenterologist can be reached at any hour by calling [phone]  [phone].   DIET: Your first meal following the procedure should be a small meal and then it is ok to progress to your normal diet. Heavy or fried foods are harder to digest and may make you feel nauseous or bloated.  Likewise, meals heavy in dairy and vegetables can increase bloating.  Drink plenty of fluids but you should avoid alcoholic beverages for 24 hours.  ACTIVITY:  You should plan to take it easy for the rest of today and you should NOT DRIVE or use heavy machinery until tomorrow (because of the sedation medicines used during the test).    FOLLOW UP: Our staff will call the number listed on your records the next business day following your procedure to check on you and address any questions or concerns that you may have regarding the information given to you following your procedure. If we do not reach you, we will leave a message.  However, if you are feeling well and you are not experiencing any problems, there is no need to return our call.  We will assume that you have returned to your regular daily activities without incident.  If any biopsies were taken you will be contacted by phone or by letter within the next 1-3 weeks.  Please call us at [phone] if you have not heard about the biopsies in 3 weeks.    SIGNATURES/CONFIDENTIALITY: You and/or your care partner have signed paperwork which will be entered into your electronic medical record.  These signatures attest to the fact  that that the information above on your After Visit Summary has been reviewed and is understood.  Full responsibility of the confidentiality of this discharge information lies with you and/or your care-partner.

## 2015-08-03 NOTE — Op Note (Signed)
[facility] Patient Name: Eric Adams Procedure Date: 08/03/2015 9:35 AM MRN: [id number] Endoscopist: Jerene Bears , MD Age: 45 Referring MD:  Date of Birth: 09/25/1970 Gender: Male Procedure:                Colonoscopy Indications:              Screening for colorectal malignant neoplasm, This                            is the patient's first colonoscopy Medicines:                Monitored Anesthesia Care Procedure:                Pre-Anesthesia Assessment:                           - Prior to the procedure, a History and Physical                            was performed, and patient medications and                            allergies were reviewed. The patient's tolerance of                            previous anesthesia was also reviewed. The risks                            and benefits of the procedure and the sedation                            options and risks were discussed with the patient.                            All questions were answered, and informed consent                            was obtained. Prior Anticoagulants: The patient has                            taken no previous anticoagulant or antiplatelet                            agents. ASA Grade Assessment: II - A patient with                            mild systemic disease. After reviewing the risks                            and benefits, the patient was deemed in                            satisfactory condition to undergo the procedure.  After obtaining informed consent, the colonoscope                            was passed under direct vision. Throughout the                            procedure, the patient's blood pressure, pulse, and                            oxygen saturations were monitored continuously. The                            Model CF-HQ190L [id number]) scope was introduced                            through the anus and advanced to the the cecum,                         identified by appendiceal orifice and ileocecal                            valve. The colonoscopy was performed without                            difficulty. The patient tolerated the procedure                            well. The quality of the bowel preparation was                            good. The ileocecal valve, appendiceal orifice, and                            rectum were photographed. Scope In: 9:43:52 AM Scope Out: 9:57:54 AM Scope Withdrawal Time: 0 hours 11 minutes 24 seconds  Total Procedure Duration: 0 hours 14 minutes 2 seconds  Findings:                 The digital rectal exam was normal.                           A 6 mm polyp was found in the hepatic flexure. The                            polyp was sessile. The polyp was removed with a                            cold snare. Resection and retrieval were complete.                           Two sessile polyps were found in the descending                            colon. The polyps were 3 to 4 mm in size.  These                            polyps were removed with a cold snare. Resection                            and retrieval were complete.                           The exam was otherwise without abnormality on                            direct and retroflexion views. Complications:            No immediate complications. Estimated Blood Loss:     Estimated blood loss: none. Impression:               - One 6 mm polyp at the hepatic flexure, removed                            with a cold snare. Resected and retrieved.                           - Two 3 to 4 mm polyps in the descending colon,                            removed with a cold snare. Resected and retrieved.                           - The examination was otherwise normal on direct                            and retroflexion views. Recommendation:           - Patient has a contact number available for                            emergencies.  The signs and symptoms of potential                            delayed complications were discussed with the                            patient. Return to normal activities tomorrow.                            Written discharge instructions were provided to the                            patient.                           - Resume previous diet.                           - Continue present medications.                           -  Await pathology results.                           - Repeat colonoscopy is recommended for                            surveillance. The colonoscopy date will be                            determined after pathology results from today's                            exam become available for review. Jerene Bears, MD 08/03/2015 10:01:40 AM This report has been signed electronically.

## 2015-08-03 NOTE — Progress Notes (Signed)
A/ox3 pleased with MAC, report to Tracy RN 

## 2015-08-04 ENCOUNTER — Telehealth: Payer: Self-pay | Admitting: *Deleted

## 2015-08-04 NOTE — Telephone Encounter (Signed)
No answer, left message to call if questions or concerns. 

## 2015-08-09 ENCOUNTER — Encounter: Payer: Self-pay | Admitting: Internal Medicine

## 2015-09-04 ENCOUNTER — Other Ambulatory Visit: Payer: 59

## 2015-09-06 ENCOUNTER — Other Ambulatory Visit (INDEPENDENT_AMBULATORY_CARE_PROVIDER_SITE_OTHER): Payer: 59

## 2015-09-06 DIAGNOSIS — E785 Hyperlipidemia, unspecified: Secondary | ICD-10-CM | POA: Diagnosis not present

## 2015-09-06 DIAGNOSIS — E119 Type 2 diabetes mellitus without complications: Secondary | ICD-10-CM

## 2015-09-07 LAB — LIPID PANEL
CHOLESTEROL: 203 mg/dL — AB (ref 0–200)
HDL: 41 mg/dL (ref >39.00)
LDL CALC: 134 mg/dL — AB (ref 0–99)
NonHDL: 162.33
TRIGLYCERIDES: 140 mg/dL (ref 0.0–149.0)
Total CHOL/HDL Ratio: 5
VLDL: 28 mg/dL (ref 0.0–40.0)

## 2015-09-07 LAB — BASIC METABOLIC PANEL
BUN: 11 mg/dL (ref 6–23)
CHLORIDE: 102 meq/L (ref 96–112)
CO2: 30 mEq/L (ref 19–32)
CREATININE: 1.2 mg/dL (ref 0.40–1.50)
Calcium: 9.7 mg/dL (ref 8.4–10.5)
GFR: 69.55 mL/min (ref >60.00)
Glucose, Bld: 99 mg/dL (ref 70–99)
Potassium: 3.6 mEq/L (ref 3.5–5.1)
SODIUM: 140 meq/L (ref 135–145)

## 2015-09-07 LAB — HEMOGLOBIN A1C: HEMOGLOBIN A1C: 6.4 % (ref 4.6–6.5)

## 2015-09-08 ENCOUNTER — Encounter: Payer: Self-pay | Admitting: Endocrinology

## 2015-09-08 ENCOUNTER — Ambulatory Visit (INDEPENDENT_AMBULATORY_CARE_PROVIDER_SITE_OTHER): Payer: 59 | Admitting: Endocrinology

## 2015-09-08 VITALS — BP 128/84 | HR 74 | Wt 243.0 lb

## 2015-09-08 DIAGNOSIS — E785 Hyperlipidemia, unspecified: Secondary | ICD-10-CM

## 2015-09-08 DIAGNOSIS — E119 Type 2 diabetes mellitus without complications: Secondary | ICD-10-CM

## 2015-09-08 MED ORDER — SIMVASTATIN 20 MG PO TABS: 20.0000 mg | ORAL_TABLET | Freq: Every day | ORAL | Status: DC

## 2015-09-08 MED ORDER — RAMIPRIL 5 MG PO CAPS: 5.0000 mg | ORAL_CAPSULE | Freq: Every day | ORAL | Status: DC

## 2015-09-08 NOTE — Progress Notes (Signed)
Patient ID: Eric Adams, male   DOB: [birth year], 45 y.o.   MRN: [id number]           Reason for Appointment: Follow-up for Type 2 Diabetes  Referring physician: Burnice Logan  History of Present Illness:          Date of diagnosis of type 2 diabetes mellitus:        Background history:   Apparently the patient was being seen for routine physical exam in his blood sugar was found to be 169 random A1c was 8.6 and he was told to have diabetes He was not having any symptoms of increased thirst or urination or fatigue  Recent history:   The patient was on a regimen of Jardiance and metformin 500 mg when he was first seen He was told to increase his metformin to 3 tablets a day and is taking 1500 mg without side effects. Jardiance was stopped.  His A1c is excellent at 6.4 and stable  Current blood sugar patterns and problems identified:  He has had mostly good blood sugars at home and these seem to be fairly consistently and normal stress test strip readings after meals in the evening  Even with exercise weight stayed about the same  He has been compliant with his metformin without any side effects . He has seen the dietitian 12/15/2014  Oral hypoglycemic drugs the patient is taking are: metformin 500 mg a.m., 1000 mg p.m. Side effects from medications have been: None  Compliance with the medical regimen: Fair  Glucose monitoring:  done 2 times a day         Glucometer: One Touch.      Blood Glucose readings   Mean values apply above for all meters except median for One Touch  PRE-MEAL Fasting Lunch Dinner Bedtime Overall  Glucose range: 110-131  111-118   104-136    Mean/median: 120     116    Self-care: The diet that the patient has been following is: tries to limit carbohydrates     Meal times: Breakfast: 5 AM  Lunch:11:30 AM  Dinner: 8:30 PM    Typical meal intake: Breakfast is oatmeal with peanuts                Dietician visit, most recent: 11/2014                 Exercise: At Gym 45 min, walks,runs,  Weights; 3 times a week   Weight history:  Wt Readings from Last 3 Encounters:  09/08/15 243 lb (110.224 kg)  08/03/15 244 lb (110.678 kg)  07/21/15 244 lb (110.678 kg)    Glycemic control:   Lab Results  Component Value Date   HGBA1C 6.4 09/06/2015   HGBA1C 6.3 05/03/2015   HGBA1C 6.7 02/06/2015   Lab Results  Component Value Date   MICROALBUR 0.9 05/03/2015   LDLCALC 134* 09/06/2015   CREATININE 1.20 09/06/2015    OTHER problems: Addressed in the review of systems     Medication List       This list is accurate as of: 09/08/15 11:59 PM.  Always use your most recent med list.               glucose blood test strip  Commonly known as:  ONETOUCH VERIO  USE TO CHECK BLOOD SUGAR TWICE A DAY AND PRN     metFORMIN 1000 MG tablet  Commonly known as:  GLUCOPHAGE  1/2 am and 1 pcs  ONE TOUCH LANCETS Misc  USE TO CHECK BLOOD SUGAR TWICE A DAY AND PRN     ramipril 5 MG capsule  Commonly known as:  ALTACE  Take 1 capsule (5 mg total) by mouth daily.     simvastatin 20 MG tablet  Commonly known as:  ZOCOR  Take 1 tablet (20 mg total) by mouth at bedtime.     SUPREP BOWEL PREP KIT 17.5-3.13-1.6 GM/180ML Soln  Generic drug:  Na Sulfate-K Sulfate-Mg Sulf  Take 1 kit by mouth once.        Allergies: No Known Allergies  Past Medical History  Diagnosis Date  . Hepatic steatosis   . H. pylori infection   . Diabetes mellitus without complication ([place])   . Hypertension     Past Surgical History  Procedure Laterality Date  . Foot surgery Right   . Breath tek h pylori N/A 12/21/2012    Procedure: BREATH TEK H PYLORI;  Surgeon: Jerene Bears, MD;  Location: [facility];  Service: Gastroenterology;  Laterality: N/A;    Family History  Problem Relation Age of Onset  . Colon cancer Neg Hx   . Diabetes Neg Hx   . Heart disease Neg Hx   . Hypertension Neg Hx   . Kidney disease Neg Hx     Social History:  reports  that he has never smoked. He has never used smokeless tobacco. He reports that he drinks alcohol. He reports that he does not use illicit drugs.    Review of Systems    Lipid history: He has not been on any medications , has high triglycerides which were improved on the last visit LDL is high Previously was given pravastatin but he did not take it   Lab Results  Component Value Date   CHOL 203* 09/06/2015   HDL 41.00 09/06/2015   LDLCALC 134* 09/06/2015   LDLDIRECT 114.0 02/06/2015   TRIG 140.0 09/06/2015   CHOLHDL 5 09/06/2015           Hypertension: His blood pressure has been high and he is Supposed to be taking ramipril 5 mg daily However he now reveals that he ran out of this 3 weeks ago    BP Readings from Last 3 Encounters:  09/08/15 128/84  08/03/15 149/85  05/23/15 124/88     Physical Examination:  BP 128/84 mmHg  Pulse 74  Wt 243 lb (110.224 kg)  SpO2 93%    ASSESSMENT/PLAN  Diabetes type 2, mildly obese He has fairly good blood sugar control, currently on a regimen of metformin 1500 daily A1c is Consistently controlled at 6.4 with metformin alone This is with metformin alone He has started an exercise regimen and encouraged him to continue this  Follow-up in 4 months with labs    HYPERTENSION.  Blood pressure is Usually controlled with ramipril and he needs to go back on it  HYPERCHOLESTEROLEMIA:  LDL is high although triglycerides better He was started on Zocor 20 mg daily    There are no Patient Instructions on file for this visit.   Eric Adams 09/10/2015, 4:05 PM   Note: This office note was prepared with Dragon voice recognition system technology. Any transcriptional errors that result from this process are unintentional.

## 2015-09-19 ENCOUNTER — Other Ambulatory Visit: Payer: Self-pay | Admitting: Internal Medicine

## 2015-10-09 IMAGING — US US ABDOMEN COMPLETE
1 series · 13 of 25 positions shown · non-contrast
Comparison: None.

CLINICAL DATA: Upper abdominal pain

EXAM:
ULTRASOUND ABDOMEN COMPLETE

[Series 1: us abdomen complete · 0.27mm/px · 13 of 82 slices shown]
[im 1/82]
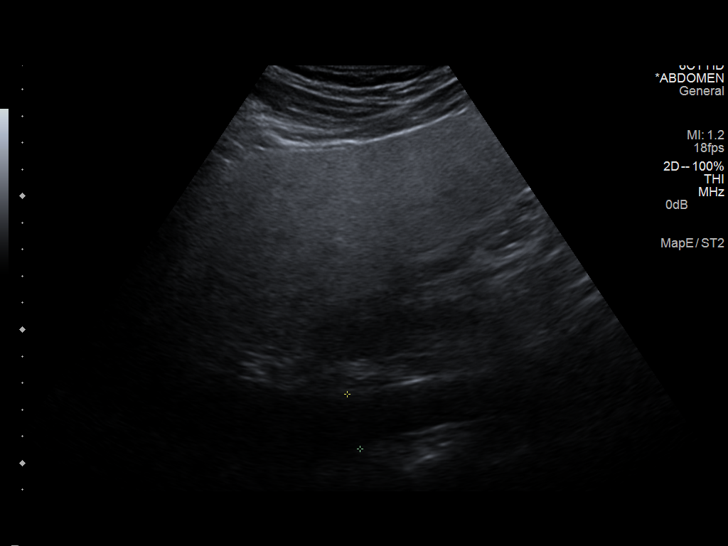
[im 7/82]
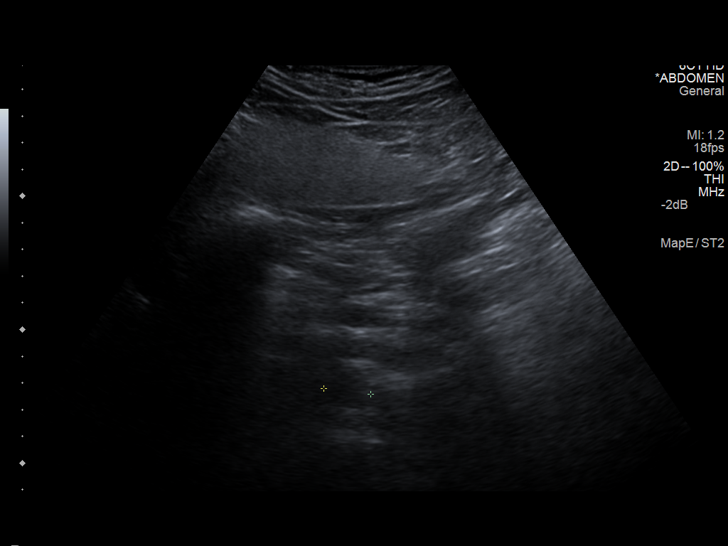
[im 14/82]
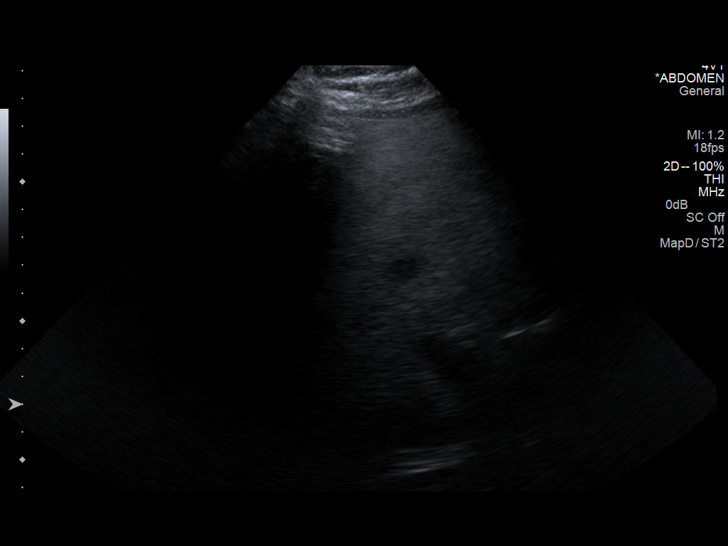
[im 21/82]
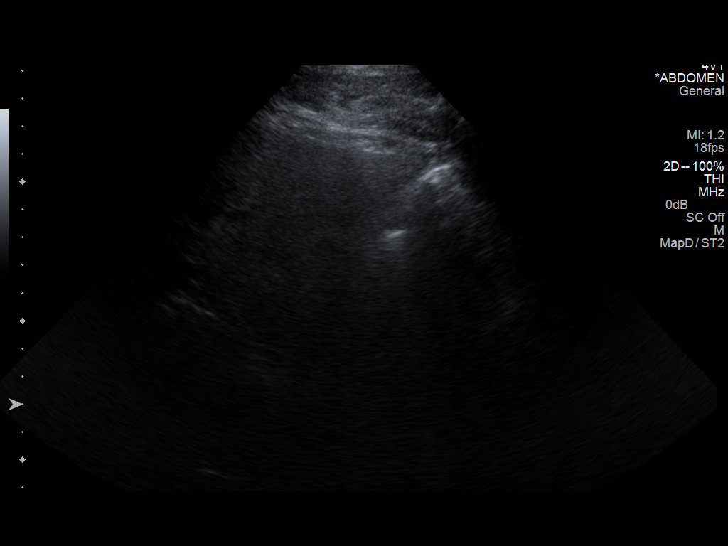
[im 28/82]
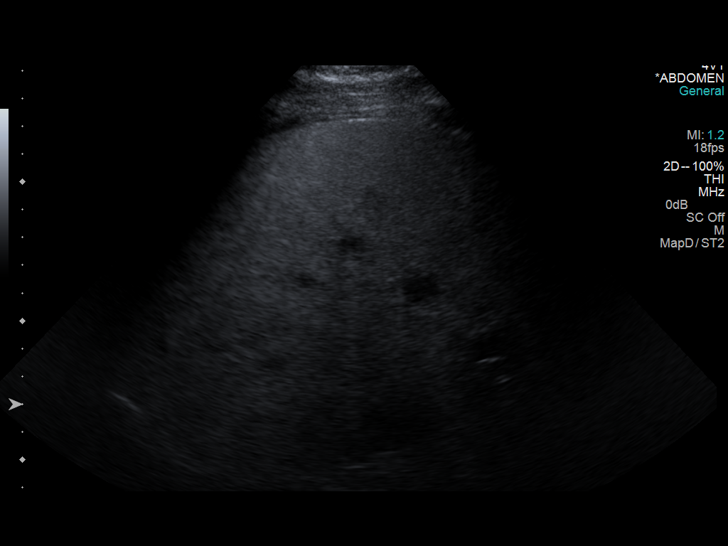
[im 34/82]
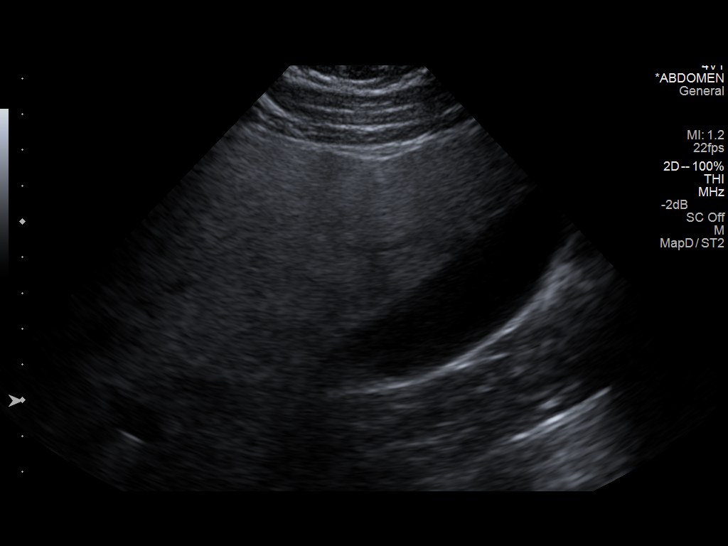
[im 41/82]
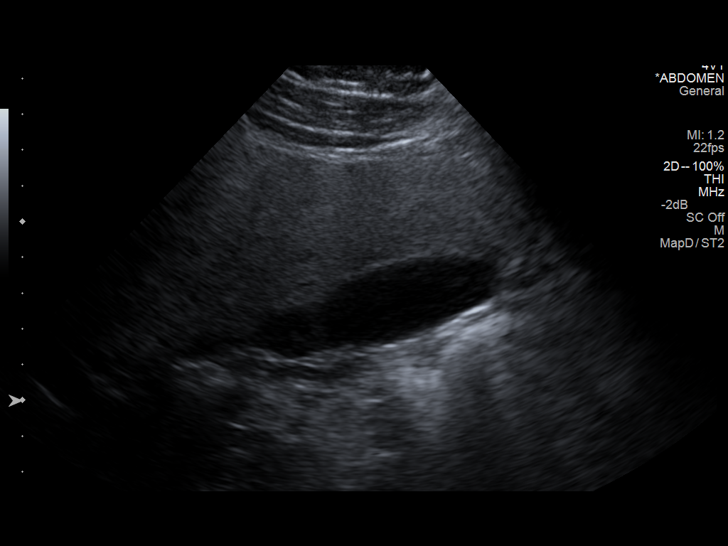
[im 48/82]
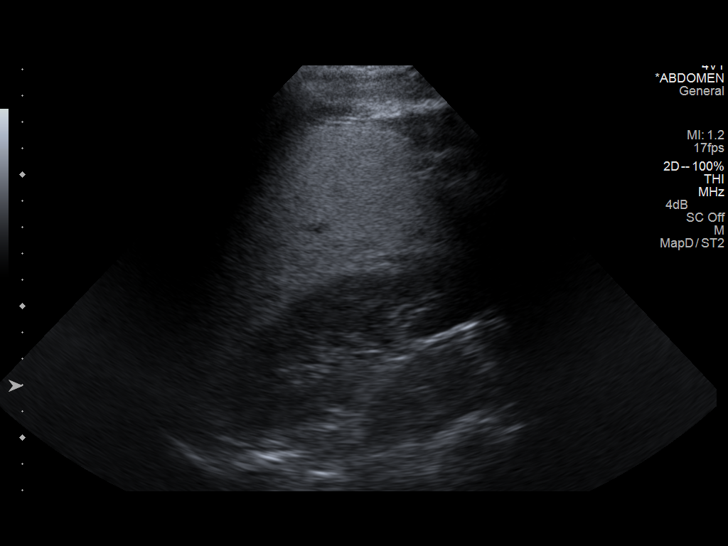
[im 55/82]
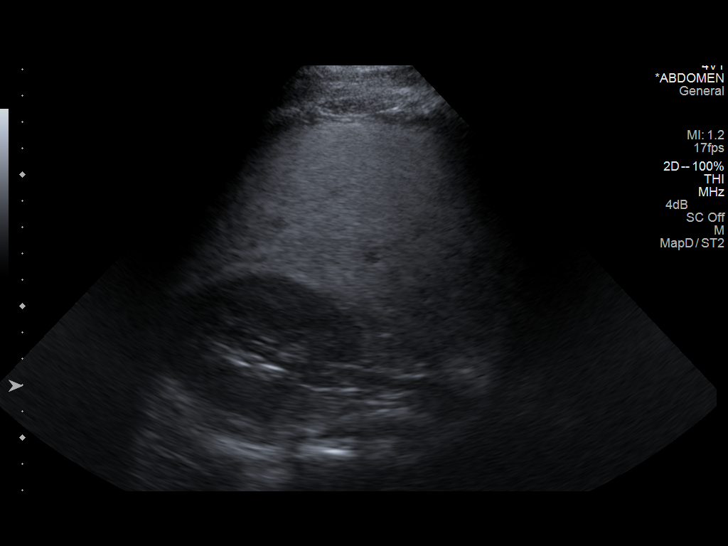
[im 61/82]
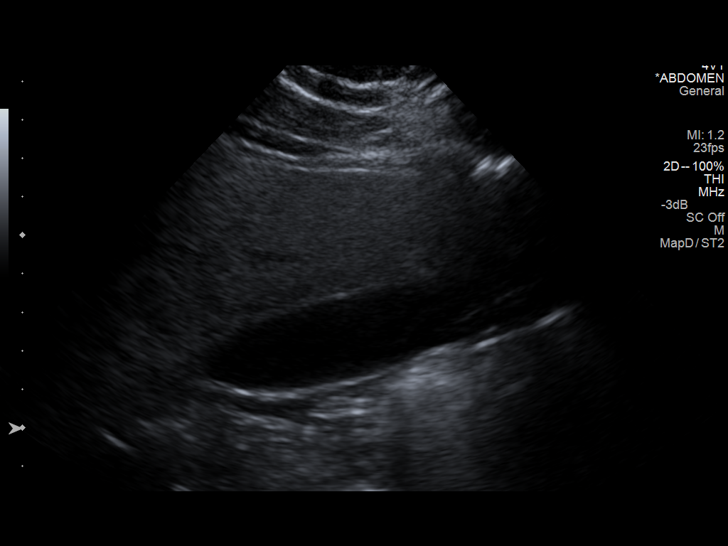
[im 68/82]
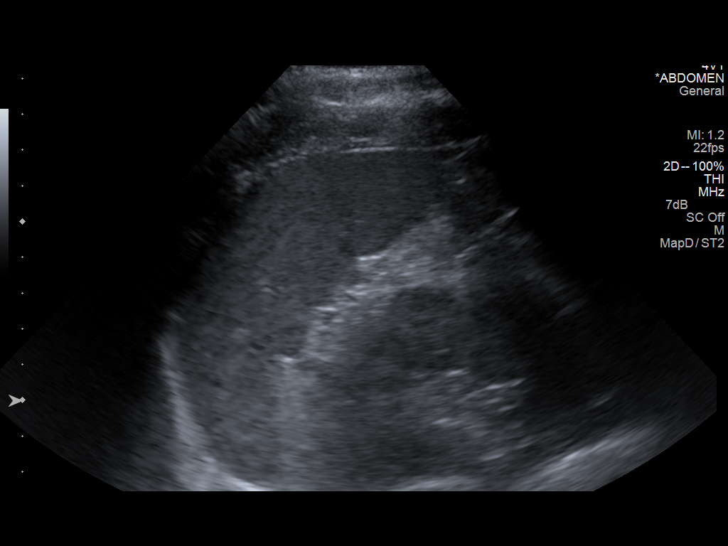
[im 75/82]
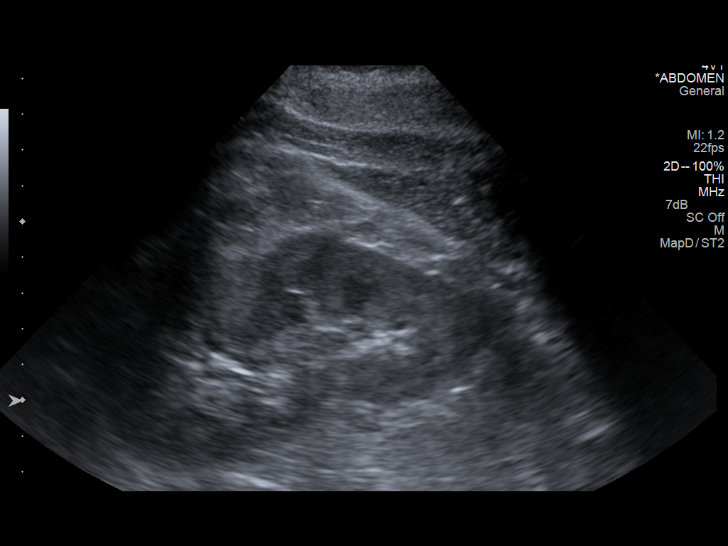
[im 82/82]
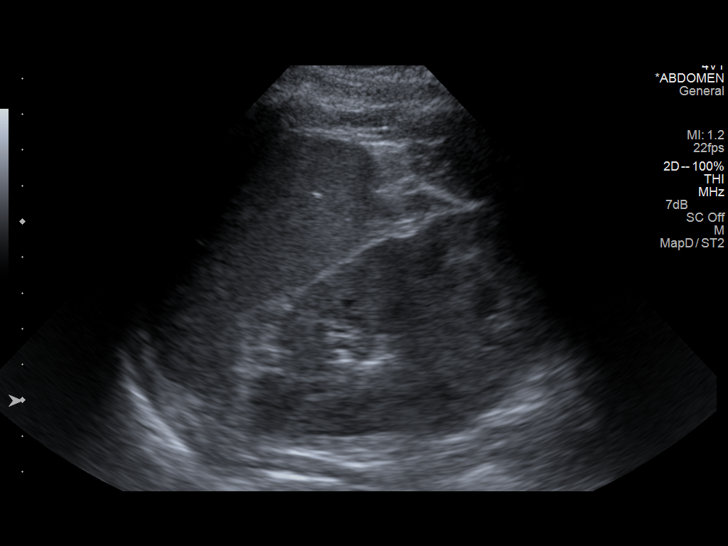

[13 of 25 positions shown; findings below may reference images not displayed]

FINDINGS: Gallbladder:

No gallstones or wall thickening visualized. There is no
pericholecystic fluid. No sonographic Murphy sign noted.

Common bile duct:

Diameter: 5 mm. There is no intrahepatic, common hepatic, or common
bile duct dilatation.

Liver:

There is diffuse increased echogenicity throughout the liver.

IVC:

No abnormality visualized.

Pancreas:

Visualized portion unremarkable. Most of pancreas is obscured by
gas.

Spleen:

Size and appearance within normal limits.

Right Kidney:

Length: 10.6 cm. Echogenicity within normal limits. No mass or
hydronephrosis visualized.

Left Kidney:

Length: 10.9 cm. Echogenicity within normal limits. No mass or
hydronephrosis visualized.

Abdominal aorta:

No aneurysm visualized.

Other findings:

No demonstrable ascites.
IMPRESSION: Liver shows diffuse increased echogenicity, probably indicative of
diffuse hepatic steatosis. While no focal liver lesions are
identified, it must be cautioned that the sensitivity of ultrasound
for focal liver lesions is diminished significantly given this
degree of underlying fatty change.

Most of pancreas obscured by gas.

Study otherwise unremarkable.

## 2015-12-13 ENCOUNTER — Ambulatory Visit: Payer: 59 | Admitting: Endocrinology

## 2015-12-13 DIAGNOSIS — Z0289 Encounter for other administrative examinations: Secondary | ICD-10-CM

## 2015-12-14 ENCOUNTER — Encounter: Payer: Self-pay | Admitting: *Deleted

## 2016-04-18 ENCOUNTER — Other Ambulatory Visit: Payer: Self-pay | Admitting: Internal Medicine

## 2016-07-29 ENCOUNTER — Encounter: Payer: Self-pay | Admitting: *Deleted

## 2016-08-07 ENCOUNTER — Ambulatory Visit (INDEPENDENT_AMBULATORY_CARE_PROVIDER_SITE_OTHER): Payer: 59 | Admitting: Internal Medicine

## 2016-08-07 ENCOUNTER — Encounter: Payer: Self-pay | Admitting: Internal Medicine

## 2016-08-07 VITALS — BP 124/86 | HR 80 | Ht 66.0 in | Wt 249.8 lb

## 2016-08-07 DIAGNOSIS — R195 Other fecal abnormalities: Secondary | ICD-10-CM

## 2016-08-07 DIAGNOSIS — K589 Irritable bowel syndrome without diarrhea: Secondary | ICD-10-CM | POA: Diagnosis not present

## 2016-08-07 DIAGNOSIS — Z86010 Personal history of colonic polyps: Secondary | ICD-10-CM

## 2016-08-07 MED ORDER — COLESTIPOL HCL 1 G PO TABS: 2.0000 g | ORAL_TABLET | Freq: Every day | ORAL | 3 refills | Status: DC

## 2016-08-07 NOTE — Patient Instructions (Signed)
We have sent the following medications to your pharmacy for you to pick up at your convenience: Colestipol 2 grams daily  Call our office if the colestipol is not helping.  If you are age 46 or older, your body mass index should be between 23-30. Your Body mass index is 40.32 kg/m. If this is out of the aforementioned range listed, please consider follow up with your Primary Care Provider.  If you are age 77 or younger, your body mass index should be between 19-25. Your Body mass index is 40.32 kg/m. If this is out of the aformentioned range listed, please consider follow up with your Primary Care Provider.

## 2016-08-07 NOTE — Progress Notes (Signed)
   Subjective:    Patient ID: Eric Adams, male    DOB: 25-May-1970, 46 y.o.   MRN: [id number]  HPI Zhamir Pirro is a 46 year old male with a history of H. pylori gastritis status post treatment, colon polyps, diabetes, intermittent loose stools and fatty liver who is here for follow-up. He is here today with his wife. He was last seen in the office in March 2017 and for colonoscopy in June 2017.  Colonoscopy revealed 3 polyps ranging in size from 3-6 mm which were removed. Exam was otherwise without abnormality. These polyps were found to be tubular adenomas without high-grade dysplasia. There is no evidence of colitis.  He reports overall he has been feeling well. Metformin definitively causes loose stools which are urgent. Even without metformin he can have intermittent loose stools particularly after eating. This is not associated with abdominal pain. There is no nausea, vomiting, dysphagia or odynophagia. He denies blood in his stool and melena. Loose stools seem to be worse if he skips a meal and then eats a larger dinner. He has been avoiding metformin for many months now. He is trying to control his blood sugars by eating a healthy diet and working out in the evenings.  H. pylori stool antigen was checked after his last visit and negative which was confirmation of eradication.  Review of Systems As per history of present illness, otherwise negative  Current Medications, Allergies, Past Medical History, Past Surgical History, Family History and Social History were reviewed in [employer] record.     Objective:   Physical Exam BP 124/86   Pulse 80   Ht 5\' 6"  (1.676 m)   Wt 249 lb 12.8 oz (113.3 kg)   BMI 40.32 kg/m  Constitutional: Well-developed and well-nourished. No distress. HEENT: Normocephalic and atraumatic  Conjunctivae are normal.  No scleral icterus. Neck: Neck supple. Trachea midline. Cardiovascular: Normal rate, regular rhythm and intact distal  pulses. No M/R/G Pulmonary/chest: Effort normal and breath sounds normal. No wheezing, rales or rhonchi. Abdominal: Soft, nontender, nondistended. Bowel sounds active throughout. There are no masses palpable. No hepatosplenomegaly. Extremities: no clubbing, cyanosis, or edema Neurological: Alert and oriented to person place and time. Skin: Skin is warm and dry. Psychiatric: Normal mood and affect. Behavior is normal.     Assessment & Plan:  46 year old male with a history of H. pylori gastritis status post treatment, colon polyps, diabetes, intermittent loose stools and fatty liver who is here for follow-up.  1. IBS with loose stools -- intermittent loose stools, exacerbated by metformin. He has been off the metformin for months and still has intermittent loose stools. This is felt to be an irritable bowel type loose stool after prior evaluation and unrevealing colonoscopy. I will try him on colestipol 2 g each morning. He is asked to notify me if this is not helpful or should he develop constipation. He voices understanding. I encouraged him to work with his primary care provider on additional diabetes management other than metformin if this is felt needed.  2. History of colon polyps -- surveillance colonoscopy recommended at 3 year interval based on findings. Repeat June 2020  25 minutes spent with the patient today. Greater than 50% was spent in counseling and coordination of care with the patient

## 2017-04-19 ENCOUNTER — Other Ambulatory Visit: Payer: Self-pay | Admitting: Internal Medicine

## 2017-05-06 ENCOUNTER — Telehealth: Payer: Self-pay | Admitting: Internal Medicine

## 2017-05-07 NOTE — Telephone Encounter (Signed)
Patient's wife advised that rx with 2 additional refills for colestipol was called into [facility] on 04/19/17 so patient should have refills available. He may be looking at an old bottle. She states she will call pharmacy and will contact us again if they do not have new script.

## 2017-06-24 ENCOUNTER — Other Ambulatory Visit: Payer: Self-pay | Admitting: Internal Medicine

## 2017-06-25 NOTE — Telephone Encounter (Signed)
Spoke w/ patient's wife (on [place]) and informed her that patient is past due for follow-up with Dr. Raliegh Ip. She will have patient call back to schedule.

## 2017-07-22 ENCOUNTER — Ambulatory Visit (INDEPENDENT_AMBULATORY_CARE_PROVIDER_SITE_OTHER): Payer: 59 | Admitting: Internal Medicine

## 2017-07-22 ENCOUNTER — Encounter: Payer: Self-pay | Admitting: Internal Medicine

## 2017-07-22 VITALS — BP 142/90 | HR 82 | Ht 66.0 in | Wt 252.5 lb

## 2017-07-22 DIAGNOSIS — K580 Irritable bowel syndrome with diarrhea: Secondary | ICD-10-CM | POA: Diagnosis not present

## 2017-07-22 DIAGNOSIS — K529 Noninfective gastroenteritis and colitis, unspecified: Secondary | ICD-10-CM | POA: Diagnosis not present

## 2017-07-22 DIAGNOSIS — Z86010 Personal history of colonic polyps: Secondary | ICD-10-CM

## 2017-07-22 MED ORDER — RIFAXIMIN 550 MG PO TABS: 550.0000 mg | ORAL_TABLET | Freq: Three times a day (TID) | ORAL | 0 refills | Status: DC

## 2017-07-22 NOTE — Patient Instructions (Signed)
We have sent the following medications to your pharmacy for you to pick up at your convenience: Xifaxan 550 mg three times daily x 14 days.  Hold your colestipol while you are taking your xifaxan.   A couple weeks after you have finished your xifaxan, call or email ([email or web address]) [place] to let us know how you are doing.  If you are age 47 or older, your body mass index should be between 23-30. Your Body mass index is 40.75 kg/m. If this is out of the aforementioned range listed, please consider follow up with your Primary Care Provider.  If you are age 35 or younger, your body mass index should be between 19-25. Your Body mass index is 40.75 kg/m. If this is out of the aformentioned range listed, please consider follow up with your Primary Care Provider.

## 2017-07-22 NOTE — Progress Notes (Signed)
Subjective:    Patient ID: Eric Adams, male    DOB: [birth year], 48 y.o.   MRN: [id number]  HPI Dezmond Downie is a 47 year old male with a history of H. pylori gastritis status post treatment, colon polyps, chronic loose stools, diabetes and fatty liver who is here for follow-up.  He is here today with his wife.  He was last seen on 08/07/2016.  He has continued to have issues with intermittent loose stools and diarrhea.  Symptoms seem to come and go.  We started him on colestipol with plans for 2 g daily but he admits to only using 1 g and not on a consistent basis.  He seems to have loose stools and days of diarrhea which come and go.  This seems to be worse if he has not eaten.  If he does need he also has an uncomfortable abdominal discomfort.  No blood in his stool or melena.  Weight is stable.  No nausea or vomiting.  No fevers or chills.  No night sweats.  His wife is concerned about possible underlying growth hormone abnormality and acromegaly.  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  The patient has a second endocrinology at appointment next month with Dr. Buddy Duty.  Prior colonoscopy negative for microscopic colitis though he did have adenomatous polyps.  Prior stool studies revealed a normal fecal elastase, negative infectious evaluation and negative fecal calprotectin  Review of Systems As per HPI, otherwise negative  Current Medications, Allergies, Past Medical History, Past Surgical History, Family History and Social History were reviewed in [employer] record.     Objective:   Physical Exam BP (!) 142/90   Pulse 82   Ht 5\' 6"  (1.676 m)   Wt 252 lb 8 oz (114.5 kg)   BMI 40.75 kg/m  Constitutional: Well-developed and well-nourished. No distress. HEENT: Normocephalic and atraumatic. Conjunctivae are normal.  No scleral icterus. Neck: Neck supple. Trachea midline. Cardiovascular: Normal rate, regular rhythm and  intact distal pulses.  Pulmonary/chest: Effort normal and breath sounds normal. No wheezing, rales or rhonchi. Abdominal: Soft, nontender, nondistended. Bowel sounds active throughout. There are no masses palpable.  Extremities: no clubbing, cyanosis, or edema Neurological: Alert and oriented to person place and time. Skin: Skin is warm and dry. Psychiatric: Normal mood and affect. Behavior is normal.      Assessment & Plan:  47 year old male with a history of H. pylori gastritis status post treatment, colon polyps, chronic loose stools, diabetes and fatty liver who is here for follow-up.  1.  Chronic intermittent loose stools/IBS --symptoms most consistent with IBS.  I cannot exclude bacterial overgrowth.  He has been intermittently adherent with colestipol so it is difficult to know if this will provide benefit if used on a regular basis.  He is not having additional alarm symptoms such as bleeding, weight loss.  I recommended that we treat him with rifaximin 550 mg 3 times daily x14 days for IBS with diarrhea.  He should suspend colestipol during this medication.  I would like him to call me 1 to 2 weeks after completion of rifaximin therapy to update me if symptoms have improved.  If not I recommended he resume colestipol 2 g daily and we can dose titrate from there.  2.  History of colon polyps --surveillance colonoscopy recommended in 2020  3.  Diabetes --managed by primary care but also has endocrinology appointment pending.  25 minutes spent with the patient today. Greater  than 50% was spent in counseling and coordination of care with the patient

## 2017-08-12 ENCOUNTER — Other Ambulatory Visit: Payer: Self-pay | Admitting: Internal Medicine

## 2017-08-13 NOTE — Telephone Encounter (Signed)
Patient need to schedule an ov for more refills. 

## 2017-08-13 NOTE — Telephone Encounter (Signed)
Left message with pt wife to return phone call. She stated that the pt is in school and working and it may be hard to reach him.

## 2017-10-21 IMAGING — US US SOFT TISSUE HEAD/NECK
1 series · 14 of 25 positions shown · non-contrast
Comparison: None.

CLINICAL DATA: Evaluate for thyroid goiter or nodule.

EXAM:
THYROID ULTRASOUND
TECHNIQUE: Ultrasound examination of the thyroid gland and adjacent soft
tissues was performed.

[Series 1: us soft tissue head/neck · 0.08mm/px · 14 of 43 slices shown]
[im 1/43]
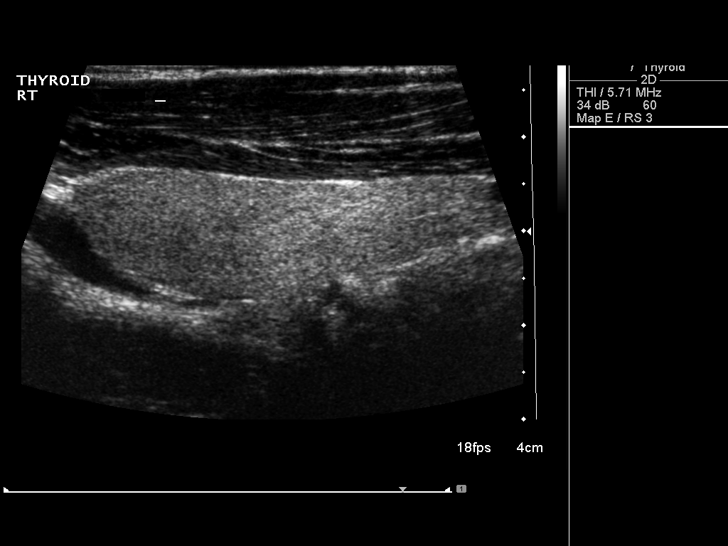
[im 4/43]
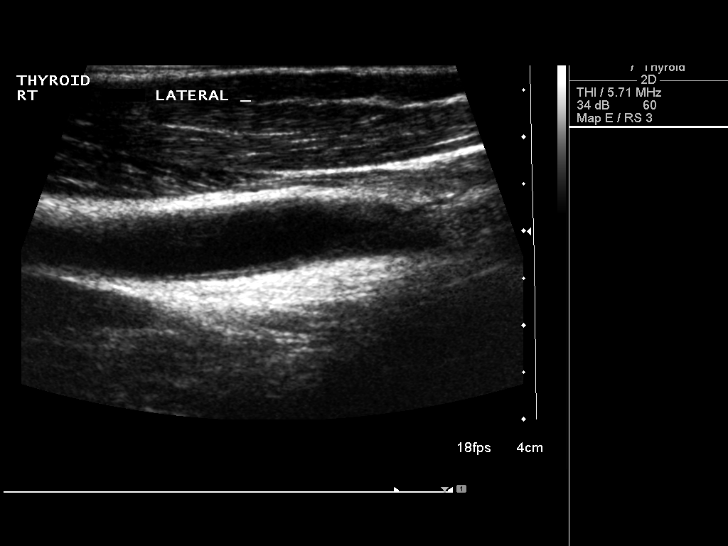
[im 8/43]
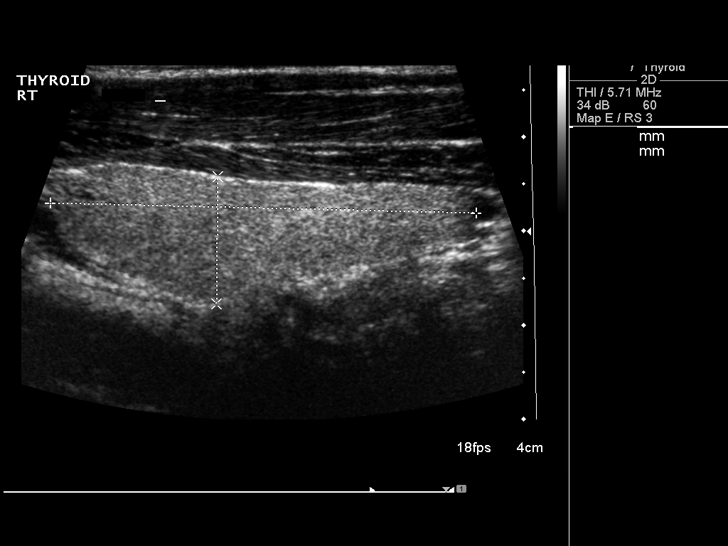
[im 11/43]
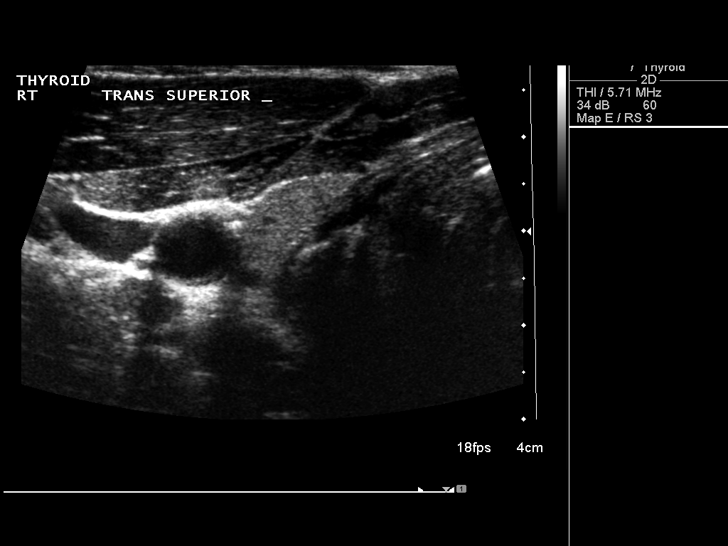
[im 15/43]
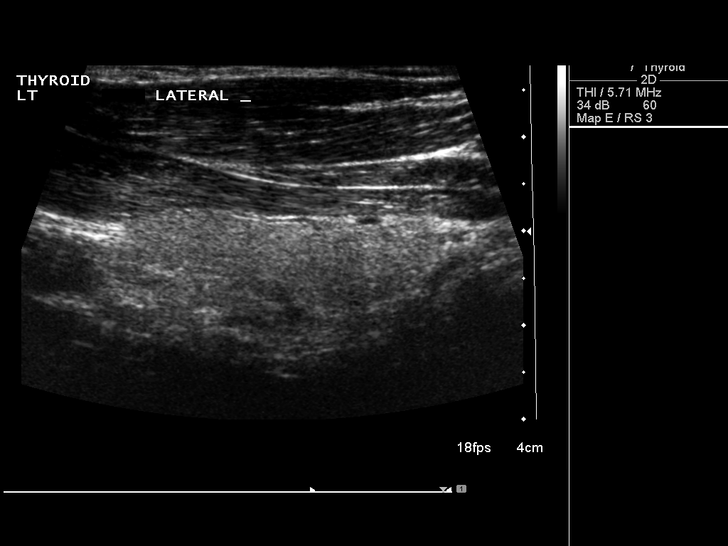
[im 16/43]
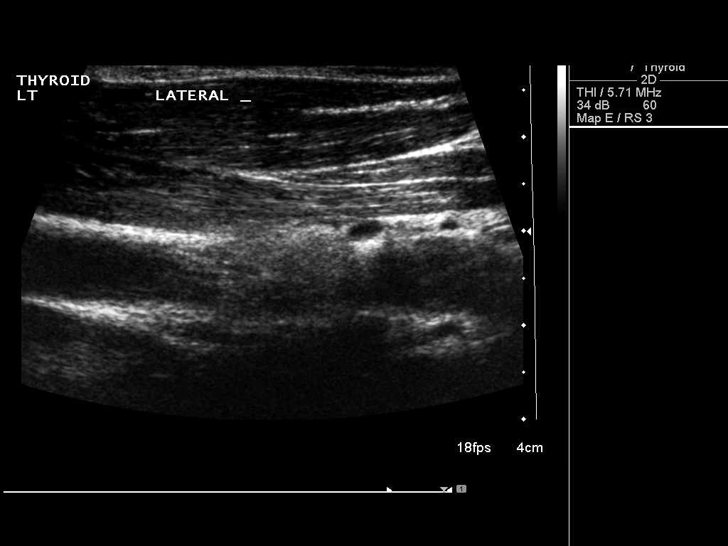
[im 20/43]
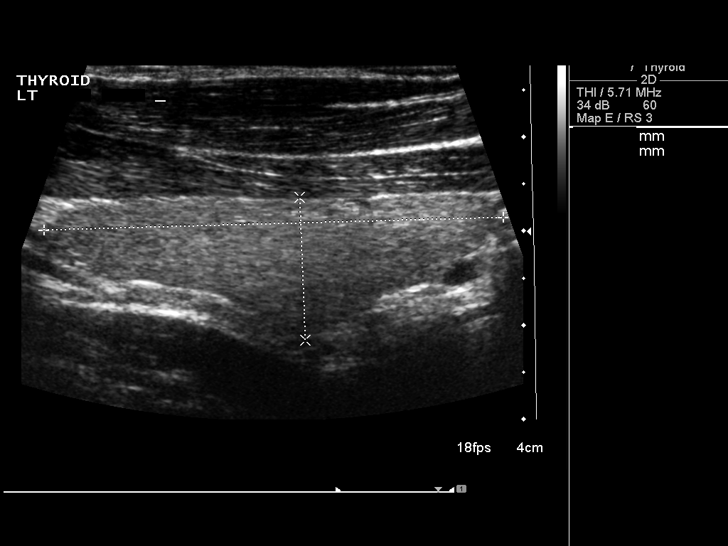
[im 23/43]
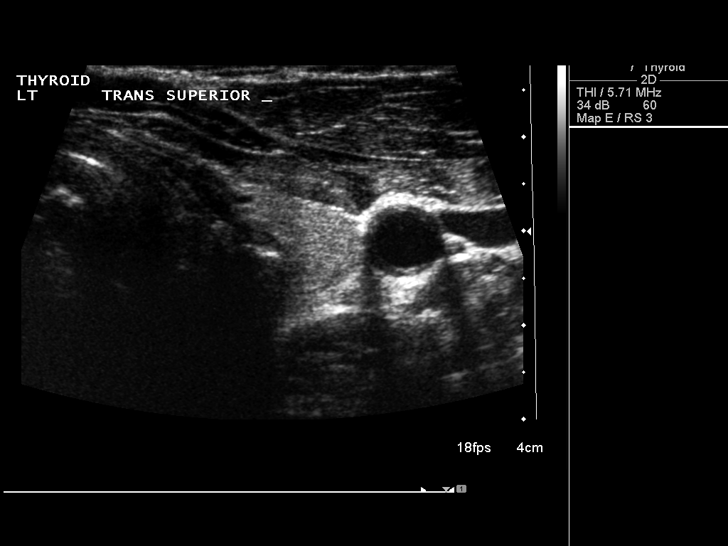
[im 27/43]
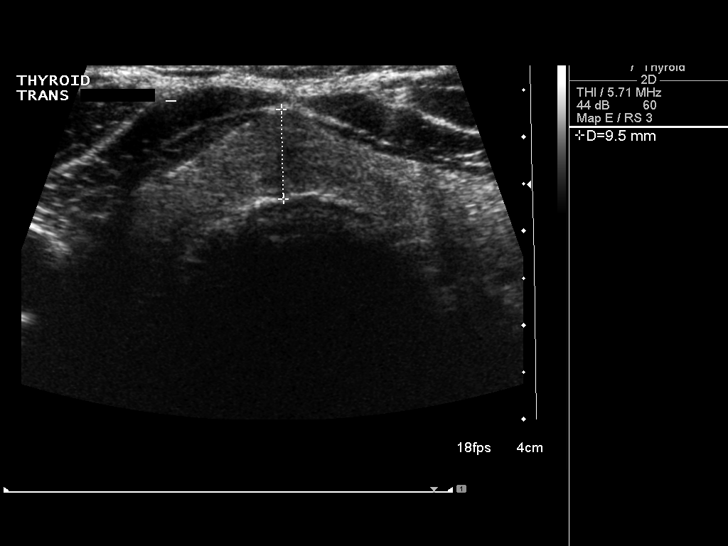
[im 29/43]
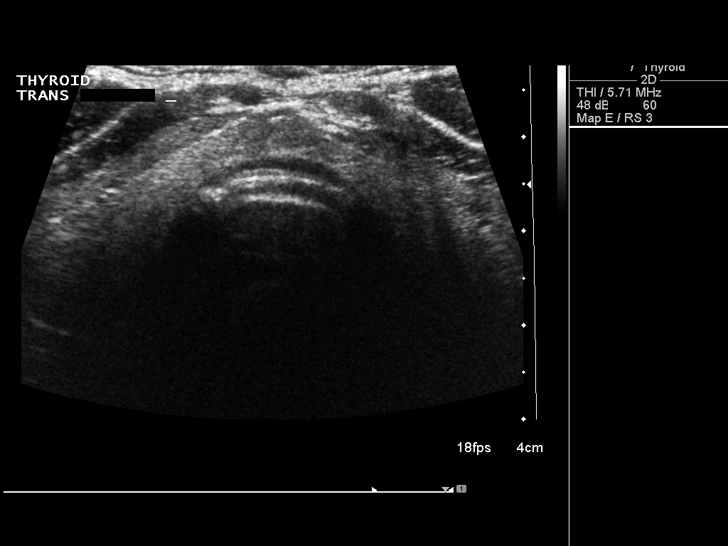
[im 32/43]
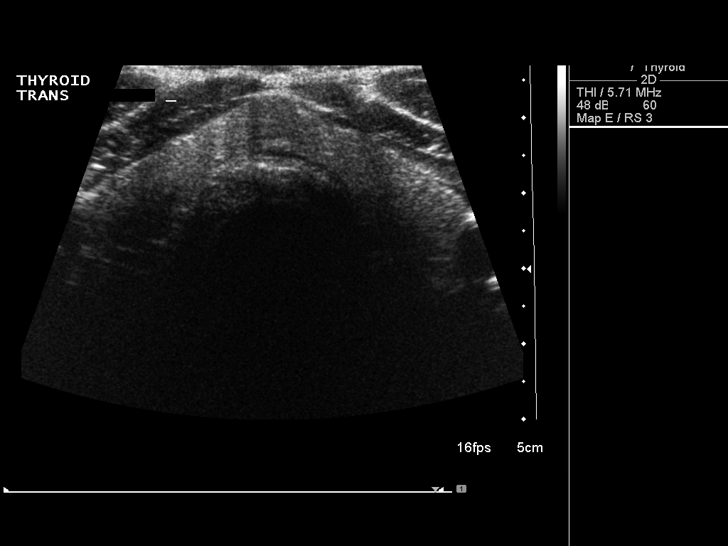
[im 36/43]
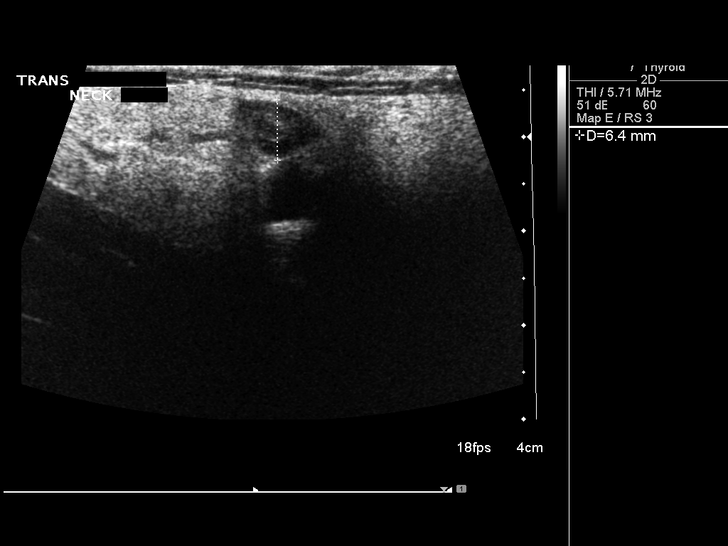
[im 39/43]
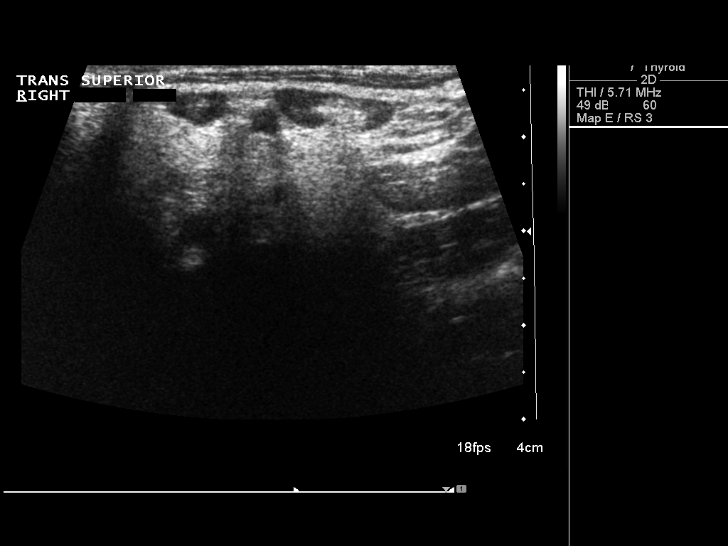
[im 43/43]
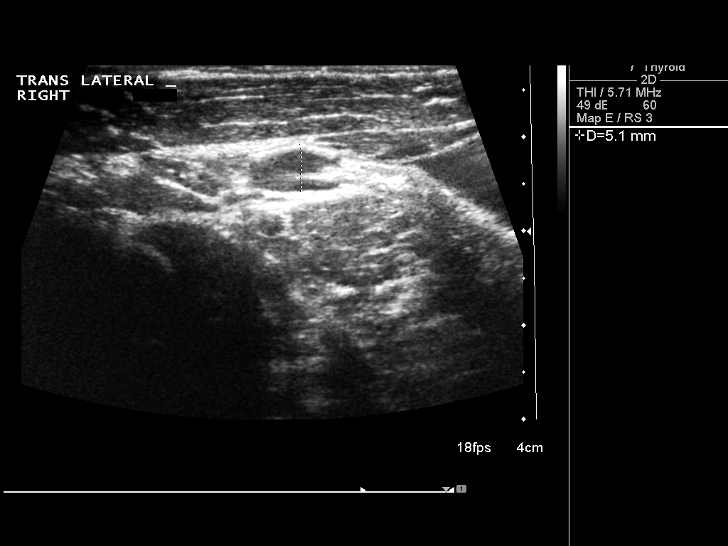

[14 of 25 positions shown; findings below may reference images not displayed]

FINDINGS: There is relative homogeneity the thyroid parenchymal echotexture.

Right thyroid lobe

Measurements: Normal in size measuring 4.5 x 1.4 x 1.6 cm.

There is relative homogeneity of the right lobe of the thyroid
without discrete nodule or mass.

Left thyroid lobe

Measurements: Normal in size measuring 4.9 x 1.5 x 1.9 cm.

There is relative homogeneity the left lobe of the thyroid without
discrete nodule or mass.

Isthmus

Thickness: Mildly enlarged measuring 1 cm in diameter..

There is relative homogeneity of the thyroid isthmus without
discrete nodule or mass.

Lymphadenopathy

None visualized.
IMPRESSION: Mild thickening of the thyroid isthmus without discrete nodule or
mass. Otherwise, normal thyroid ultrasound.

## 2018-03-10 ENCOUNTER — Ambulatory Visit: Payer: 59 | Admitting: Family Medicine

## 2018-03-11 ENCOUNTER — Encounter: Payer: Self-pay | Admitting: Family Medicine

## 2018-03-11 ENCOUNTER — Ambulatory Visit (INDEPENDENT_AMBULATORY_CARE_PROVIDER_SITE_OTHER): Payer: 59 | Admitting: Family Medicine

## 2018-03-11 ENCOUNTER — Ambulatory Visit: Payer: 59 | Admitting: Family Medicine

## 2018-03-11 ENCOUNTER — Ambulatory Visit (INDEPENDENT_AMBULATORY_CARE_PROVIDER_SITE_OTHER): Payer: 59

## 2018-03-11 VITALS — BP 140/90 | HR 79 | Temp 98.3°F | Resp 12 | Ht 66.0 in | Wt 244.4 lb

## 2018-03-11 DIAGNOSIS — R05 Cough: Secondary | ICD-10-CM

## 2018-03-11 DIAGNOSIS — R059 Cough, unspecified: Secondary | ICD-10-CM

## 2018-03-11 DIAGNOSIS — R0989 Other specified symptoms and signs involving the circulatory and respiratory systems: Secondary | ICD-10-CM | POA: Diagnosis not present

## 2018-03-11 DIAGNOSIS — J989 Respiratory disorder, unspecified: Secondary | ICD-10-CM

## 2018-03-11 MED ORDER — BENZONATATE 100 MG PO CAPS: 200.0000 mg | ORAL_CAPSULE | Freq: Two times a day (BID) | ORAL | 0 refills | Status: AC | PRN

## 2018-03-11 MED ORDER — ALBUTEROL SULFATE HFA 108 (90 BASE) MCG/ACT IN AERS: 2.0000 | INHALATION_SPRAY | Freq: Four times a day (QID) | RESPIRATORY_TRACT | 0 refills | Status: DC | PRN

## 2018-03-11 NOTE — Patient Instructions (Signed)
A few things to remember from today's visit:   Cough - Plan: DG Chest 2 View, benzonatate (TESSALON) 100 MG capsule  Reactive airway disease that is not asthma - Plan: albuterol (PROVENTIL HFA;VENTOLIN HFA) 108 (90 Base) MCG/ACT inhaler  Over-the-counter plain Mucinex may help. Albuterol inh 2 puff every 6 hours for a week then as needed for wheezing or shortness of breath.   Please be sure medication list is accurate. If a new problem present, please set up appointment sooner than planned today.

## 2018-03-11 NOTE — Progress Notes (Signed)
ACUTE VISIT   HPI:  Chief Complaint  Patient presents with  . Check lungs    had DOT physical and provider heard something in lungs  . Cough  . Wheezing  . Chest congestion    Eric Adams is a 48 y.o. male, who is here today because earlier today during his DOT physical abnormal lung sounds were noted. He is not sure about findings.  He is recovering from [place] having non productive cough. No sure about exacerbating or alleviating factors.  When symptoms first started he had fever,chills and changes in appetite.All these symptoms have resolved. Upper body aching when coughing.  Rattling chest when lying down. Nasal congestion,and post nasal drainage.  Symptoms started 2-3 days after arriving from [facility]. He stayed there from 02/23/18 to 03/02/18.  He denies sick contact.  He travels to [place] every 2 years.  No Hx of tobacco use,asthma,or allergies.  Hx of OSA,he wear CPAP.  Hx of DM II,following with endocrinologist at [place] 4 month ago. BS's in the low 100's.    He has not tried OTC medications.  Review of Systems  Constitutional: Positive for fatigue. Negative for activity change, appetite change, chills and fever.  HENT: Positive for congestion, postnasal drip and rhinorrhea. Negative for ear pain, mouth sores, sore throat and trouble swallowing.   Eyes: Negative for discharge, redness and itching.  Respiratory: Positive for cough. Negative for chest tightness, shortness of breath and wheezing.   Gastrointestinal: Negative for abdominal pain, diarrhea, nausea and vomiting.  Musculoskeletal: Positive for myalgias. Negative for gait problem.  Skin: Negative for rash.  Allergic/Immunologic: Negative for environmental allergies.  Neurological: Negative for weakness and headaches.  Hematological: Negative for adenopathy. Does not bruise/bleed easily.      Current Outpatient Medications on File Prior to Visit    Medication Sig Dispense Refill  . metFORMIN (GLUCOPHAGE) 1000 MG tablet 1/2 am and 1 pcs 90 tablet 3  . ONE TOUCH LANCETS MISC USE TO CHECK BLOOD SUGAR TWICE A DAY AND PRN 100 each 6  . ONETOUCH VERIO test strip USE TO CHECK BLOOD SUGAR TWICE A DAY AND AS NEEDED 100 each 0   No current facility-administered medications on file prior to visit.      Past Medical History:  Diagnosis Date  . Diabetes mellitus without complication ([place])   . H. pylori infection   . Hepatic steatosis   . Hypertension   . Tubular adenoma of colon    No Known Allergies  Social History   Socioeconomic History  . Marital status: Married    Spouse name: Not on file  . Number of children: 1  . Years of education: Not on file  . Highest education level: Not on file  Occupational History  . Occupation: Truck Education administrator: [employer]  Social Needs  . Financial resource strain: Not on file  . Food insecurity:    Worry: Not on file    Inability: Not on file  . Transportation needs:    Medical: Not on file    Non-medical: Not on file  Tobacco Use  . Smoking status: Never Smoker  . Smokeless tobacco: Never Used  Substance and Sexual Activity  . Alcohol use: Yes    Comment: glass of wine occasionally  . Drug use: No  . Sexual activity: Not on file  Lifestyle  . Physical activity:    Days per week: Not on file    Minutes  per session: Not on file  . Stress: Not on file  Relationships  . Social connections:    Talks on phone: Not on file    Gets together: Not on file    Attends religious service: Not on file    Active member of club or organization: Not on file    Attends meetings of clubs or organizations: Not on file    Relationship status: Not on file  Other Topics Concern  . Not on file  Social History Narrative  . Not on file    Vitals:   03/11/18 1543  BP: 140/90  Pulse: 79  Resp: 12  Temp: 98.3 F (36.8 C)  SpO2: 95%   Body mass index is 39.44 kg/m.   Physical  Exam  Nursing note and vitals reviewed. Constitutional: He is oriented to person, place, and time. He appears well-developed. He does not appear ill. No distress.  HENT:  Head: Normocephalic and atraumatic.  Right Ear: Tympanic membrane, external ear and ear canal normal.  Left Ear: Tympanic membrane, external ear and ear canal normal.  Mouth/Throat: Oropharynx is clear and moist and mucous membranes are normal.  Hypertrophic turbinates.  Eyes: Conjunctivae and EOM are normal.  Cardiovascular: Normal rate and regular rhythm.  No murmur heard. Respiratory: Effort normal and breath sounds normal. No stridor. No respiratory distress.  Prolonged expiration.  Lymphadenopathy:       Head (right side): No submandibular adenopathy present.       Head (left side): No submandibular adenopathy present.    He has no cervical adenopathy.  Neurological: He is alert and oriented to person, place, and time. He has normal strength. Gait normal.  Skin: Skin is warm. No rash noted. No erythema.  Psychiatric: He has a normal mood and affect.  Well groomed, good eye contact.      ASSESSMENT AND PLAN:   Eric Adams was seen today for check lungs, cough, wheezing and chest congestion.  Diagnoses and all orders for this visit:  Cough  Lung auscultation otherwise negative. Explained that cough can last a few weeks after URI. I do not think abx is needed at this time. Further recommendations will be given according to CXR.  -     DG Chest 2 View; Future -     benzonatate (TESSALON) 100 MG capsule; Take 2 capsules (200 mg total) by mouth 2 (two) times daily as needed for up to 10 days.  Reactive airway disease that is not asthma Negative wheezing today. Albuterol inh 2 puff every 6 hours for a week then as needed for wheezing or shortness of breath.  I do not think prednisone is needed at this time. Instructed about warning signs. F/U with PCP as nedeed.  -     albuterol (PROVENTIL HFA;VENTOLIN  HFA) 108 (90 Base) MCG/ACT inhaler; Inhale 2 puffs into the lungs every 6 (six) hours as needed for up to 30 days for wheezing or shortness of breath.     Return if symptoms worsen or fail to improve.      Evangelynn Lochridge G. [place], MD  [facility]. [place] office.

## 2018-03-17 ENCOUNTER — Encounter: Payer: Self-pay | Admitting: Family Medicine

## 2018-03-17 ENCOUNTER — Ambulatory Visit (INDEPENDENT_AMBULATORY_CARE_PROVIDER_SITE_OTHER): Payer: Commercial Managed Care - PPO | Admitting: Family Medicine

## 2018-03-17 VITALS — BP 124/90 | HR 76 | Temp 98.3°F | Resp 12 | Ht 66.0 in | Wt 247.0 lb

## 2018-03-17 DIAGNOSIS — I429 Cardiomyopathy, unspecified: Secondary | ICD-10-CM | POA: Diagnosis not present

## 2018-03-17 DIAGNOSIS — G4733 Obstructive sleep apnea (adult) (pediatric): Secondary | ICD-10-CM

## 2018-03-17 DIAGNOSIS — E1159 Type 2 diabetes mellitus with other circulatory complications: Secondary | ICD-10-CM | POA: Diagnosis not present

## 2018-03-17 DIAGNOSIS — I152 Hypertension secondary to endocrine disorders: Secondary | ICD-10-CM

## 2018-03-17 DIAGNOSIS — I10 Essential (primary) hypertension: Secondary | ICD-10-CM

## 2018-03-17 MED ORDER — LOSARTAN POTASSIUM 25 MG PO TABS: 25.0000 mg | ORAL_TABLET | Freq: Every day | ORAL | 2 refills | Status: DC

## 2018-03-17 NOTE — Patient Instructions (Signed)
A few things to remember from today's visit:   Obstructive sleep apnea  Hypertension associated with diabetes ([place]) - Plan: losartan (COZAAR) 25 MG tablet, Basic metabolic panel  Losartan 25 mg added today. Please try to get report from echo done in the past. Monitor your blood pressure at home. Lab appointment in 7 to 10 days.  Please be sure medication list is accurate. If a new problem present, please set up appointment sooner than planned today.

## 2018-03-17 NOTE — Progress Notes (Signed)
HPI:   Eric Adams is a 48 y.o. male, who is here today to follow on recent OV. He would like to go though CXR results, done on 03/12/18. Cardiomegaly with mild basilar interstitial prominence. Mild CHF cannot be excluded. Low lung volumes. He remembers having an echo about 8 years ago.    According to patient, he has had echo that showed "dilated" heart but he was reassured. He denies orthopnea,PND,or edema.   Elevated BP: Today BP slightly elevated. 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 BP 147/90. His BP has been elevated intermittently during OV's.  No prior Hx of HTN. Denies severe/frequent headache, visual changes, chest pain, dyspnea, or palpitation.   He exercises regularly, 3-4 times per week. He tries to follow a healthful diet.  OSA,he wears his CPAP daily. He follows with "clinic" at work.  DM II,he follows with Dr Dwyane Dee.    Review of Systems  Constitutional: Negative for activity change, appetite change, fatigue and fever.  Eyes: Negative for redness and visual disturbance.  Respiratory: Positive for cough (Improving). Negative for shortness of breath and wheezing.   Cardiovascular: Negative for chest pain, palpitations and leg swelling.  Gastrointestinal: Negative for abdominal pain, nausea and vomiting.  Genitourinary: Negative for decreased urine volume, dysuria and hematuria.  Neurological: Negative for syncope, weakness and headaches.      Current Outpatient Medications on File Prior to Visit  Medication Sig Dispense Refill  . albuterol (PROVENTIL HFA;VENTOLIN HFA) 108 (90 Base) MCG/ACT inhaler Inhale 2 puffs into the lungs every 6 (six) hours as needed for up to 30 days for wheezing or shortness of breath. 1 Inhaler 0  . benzonatate (TESSALON) 100 MG capsule Take 2 capsules (200 mg total) by mouth 2 (two) times daily as needed for up to 10 days. 40 capsule 0  .  metFORMIN (GLUCOPHAGE) 1000 MG tablet 1/2 am and 1 pcs 90 tablet 3  . ONE TOUCH LANCETS MISC USE TO CHECK BLOOD SUGAR TWICE A DAY AND PRN 100 each 6  . ONETOUCH VERIO test strip USE TO CHECK BLOOD SUGAR TWICE A DAY AND AS NEEDED 100 each 0   No current facility-administered medications on file prior to visit.      Past Medical History:  Diagnosis Date  . Diabetes mellitus without complication ([place])   . H. pylori infection   . Hepatic steatosis   . Hypertension   . Tubular adenoma of colon    No Known Allergies  Social History   Socioeconomic History  . Marital status: Married    Spouse name: Not on file  . Number of children: 1  . Years of education: Not on file  . Highest education level: Not on file  Occupational History  . Occupation: Truck Education administrator: [employer]  Social Needs  . Financial resource strain: Not on file  . Food insecurity:    Worry: Not on file    Inability: Not on file  . Transportation needs:    Medical: Not on file    Non-medical: Not on file  Tobacco Use  . Smoking status: Never Smoker  . Smokeless tobacco: Never Used  Substance and Sexual Activity  . Alcohol use: Yes    Comment: glass of wine occasionally  . Drug use: No  . Sexual activity: Not on file  Lifestyle  . Physical activity:  Days per week: Not on file    Minutes per session: Not on file  . Stress: Not on file  Relationships  . Social connections:    Talks on phone: Not on file    Gets together: Not on file    Attends religious service: Not on file    Active member of club or organization: Not on file    Attends meetings of clubs or organizations: Not on file    Relationship status: Not on file  Other Topics Concern  . Not on file  Social History Narrative  . Not on file    Vitals:   03/17/18 1608  BP: 124/90  Pulse: 76  Resp: 12  Temp: 98.3 F (36.8 C)   Body mass index is 39.87 kg/m.   Physical Exam  Nursing note and vitals  reviewed. Constitutional: He is oriented to person, place, and time. He appears well-developed. No distress.  HENT:  Head: Normocephalic and atraumatic.  Mouth/Throat: Oropharynx is clear and moist and mucous membranes are normal.  Eyes: Pupils are equal, round, and reactive to light. Conjunctivae are normal.  Neck: No JVD present.  Cardiovascular: Normal rate and regular rhythm.  No murmur heard. Respiratory: Effort normal and breath sounds normal. No respiratory distress.  GI: Soft. He exhibits no mass. There is no hepatomegaly. There is no abdominal tenderness.  Musculoskeletal:        General: No edema.  Lymphadenopathy:    He has no cervical adenopathy.  Neurological: He is alert and oriented to person, place, and time. He has normal strength. No cranial nerve deficit. Gait normal.  Skin: Skin is warm. No rash noted. No erythema.  Psychiatric: He has a normal mood and affect.  Well groomed, good eye contact.    ASSESSMENT AND PLAN:  Mr. Rishaan was seen today for follow-up.  Diagnoses and all orders for this visit:  Hypertension associated with diabetes ([place]) After discussion of some pharmacologic options, he agrees with taking Losartan 25 mg daily. Some side effects discussed. Monitor BP at home, Rx for BP monitor given. Low salt diet. BMP in 7-10 days.  -     losartan (COZAAR) 25 MG tablet; Take 1 tablet (25 mg total) by mouth daily. -     Basic metabolic panel; Future  Cardiomyopathy, unspecified type ([place]) Per CXR report. He prefers to hold on echo until he has information about prior work up he has had in the past.  Morbid obesity ([place]) With DM II and HTN. We discussed benefits of wt loss as well as adverse effects of obesity. Consistency with healthy diet and physical activity recommended.   Obstructive sleep apne Wearing CPAP daily. Continue following with sleep clinic.   Return in about 4 months (around 07/16/2018) for HTN. Lab in 7-10 d.    Brandey Vandalen G.  [place], MD  [facility]. [place] office.

## 2018-03-20 ENCOUNTER — Telehealth: Payer: Self-pay | Admitting: *Deleted

## 2018-03-20 NOTE — Telephone Encounter (Signed)
I think this was already done. Thanks, BJ

## 2018-03-20 NOTE — Telephone Encounter (Signed)
Spoke with patient and he stated that he lost his Rx for the blood pressure monitor and would like a new one. Would like to pick up today.   Copied from [place] [phone]. Topic: General - Other >> Mar 19, 2018  5:17 PM Alanda Slim E wrote: Reason for CRM: Pt called and left the written Rx in the office/ Pt doesn't know the name of the medication and wants to know if can come in and pick up another script.  please call Pt and advise [phone]

## 2018-03-23 NOTE — Telephone Encounter (Signed)
Rx left at front desk for patient to pick-up on 03/20/2018.

## 2018-03-24 ENCOUNTER — Telehealth: Payer: Self-pay | Admitting: Internal Medicine

## 2018-03-24 ENCOUNTER — Other Ambulatory Visit: Payer: Self-pay | Admitting: Family Medicine

## 2018-03-24 ENCOUNTER — Other Ambulatory Visit: Payer: Commercial Managed Care - PPO

## 2018-03-24 DIAGNOSIS — I429 Cardiomyopathy, unspecified: Secondary | ICD-10-CM

## 2018-03-24 DIAGNOSIS — I10 Essential (primary) hypertension: Principal | ICD-10-CM

## 2018-03-24 DIAGNOSIS — E1159 Type 2 diabetes mellitus with other circulatory complications: Secondary | ICD-10-CM

## 2018-03-24 DIAGNOSIS — I152 Hypertension secondary to endocrine disorders: Secondary | ICD-10-CM

## 2018-03-24 NOTE — Telephone Encounter (Signed)
I do not see any mention in LOV note about ordering an Echo.

## 2018-03-24 NOTE — Telephone Encounter (Signed)
Spoke with patient when he came into the office today for blood work concerning ECHO. After speaking with Dr. Martinique, at last OV Echo was not ordered because he told Dr. Martinique that he had one done previously and would have record sent to her. Patient aware and verbalized understanding. Patient informed at office on 03/24/2018 that Dr. Martinique would place referral and we would call wife when it was done.

## 2018-03-24 NOTE — Telephone Encounter (Signed)
Copied from [phone]. Topic: General - Other >> Mar 24, 2018 11:41 AM Keene Breath wrote: Reason for CRM: Patient's wife called to request that patient have an Echo test scheduled for patient.  The patient has a lab appointment today, and he thought that he was supposed to have the Echo done as well.  Please advise and call patient back at [phone]

## 2018-03-24 NOTE — Telephone Encounter (Signed)
Referral for echo placed. Diagnosis: Nonspecific cardiomyopathy.  Betty Martinique, MD

## 2018-03-25 ENCOUNTER — Encounter: Payer: Self-pay | Admitting: Family Medicine

## 2018-04-01 ENCOUNTER — Ambulatory Visit (HOSPITAL_COMMUNITY): Payer: Commercial Managed Care - PPO | Attending: Cardiovascular Disease

## 2018-04-01 DIAGNOSIS — I429 Cardiomyopathy, unspecified: Secondary | ICD-10-CM | POA: Diagnosis not present

## 2018-04-21 ENCOUNTER — Ambulatory Visit: Payer: Self-pay | Admitting: *Deleted

## 2018-04-21 NOTE — Telephone Encounter (Signed)
Message from Sharene Skeans sent at 04/21/2018 4:39 PM EST   Summary: advise on BP medication   Pt's blood pressure still has not normalized and they wanted to see about changing to a medication that would work better for him. It was 162/89 today when he checked it. He currently takes losartan (COZAAR) 25 MG tablet / please advise          Wife on the DPR called regarding her husband's b/p being elevated today at work (with the company nurse). 162/89. She stated that he had not reported any symptoms when his b/p is elevated. And since he was put on losartan , his b/p had not been normalized.  When she goes home she will get the readings that he has been getting and send them to the office. She stated the patient is requesting a different b/p medication. Routing to flow at [facility].

## 2018-04-21 NOTE — Telephone Encounter (Signed)
Usually I do not recommend changing medication unless he is having side effects. As I explained during his visit we started with low-dose and planned on titration if needed. Recommend increasing Cozaar from 25 mg to 50 mg daily. Also add amlodipine 5 mg daily. Continue monitoring BP.  Please arrange 4 weeks follow-up.  Thanks, BJ

## 2018-04-21 NOTE — Telephone Encounter (Signed)
Please advise 

## 2018-04-21 NOTE — Telephone Encounter (Signed)
Message sent to Dr. Jordan for review and approval. 

## 2018-04-22 ENCOUNTER — Other Ambulatory Visit: Payer: Self-pay | Admitting: *Deleted

## 2018-04-22 MED ORDER — AMLODIPINE BESYLATE 5 MG PO TABS: 5.0000 mg | ORAL_TABLET | Freq: Every day | ORAL | 3 refills | Status: DC

## 2018-04-22 MED ORDER — LOSARTAN POTASSIUM 50 MG PO TABS: 50.0000 mg | ORAL_TABLET | Freq: Every day | ORAL | 3 refills | Status: DC

## 2018-04-22 NOTE — Telephone Encounter (Signed)
Spoke with patient's wife and gave instructions per Dr. Martinique and verbalized understanding. Rx's sent to pharmacy. Patient scheduled for 05/20/2018 for Transfer of Care and B/p follow-up.

## 2018-05-20 ENCOUNTER — Telehealth: Payer: Self-pay | Admitting: Family Medicine

## 2018-05-20 ENCOUNTER — Encounter: Payer: Self-pay | Admitting: Family Medicine

## 2018-05-20 ENCOUNTER — Other Ambulatory Visit: Payer: Self-pay

## 2018-05-20 ENCOUNTER — Ambulatory Visit (INDEPENDENT_AMBULATORY_CARE_PROVIDER_SITE_OTHER): Payer: Commercial Managed Care - PPO | Admitting: Family Medicine

## 2018-05-20 DIAGNOSIS — E785 Hyperlipidemia, unspecified: Secondary | ICD-10-CM | POA: Diagnosis not present

## 2018-05-20 DIAGNOSIS — E1169 Type 2 diabetes mellitus with other specified complication: Secondary | ICD-10-CM | POA: Insufficient documentation

## 2018-05-20 DIAGNOSIS — E118 Type 2 diabetes mellitus with unspecified complications: Secondary | ICD-10-CM

## 2018-05-20 DIAGNOSIS — I10 Essential (primary) hypertension: Secondary | ICD-10-CM | POA: Insufficient documentation

## 2018-05-20 MED ORDER — LOSARTAN POTASSIUM-HCTZ 100-25 MG PO TABS: 1.0000 | ORAL_TABLET | Freq: Every day | ORAL | 1 refills | Status: DC

## 2018-05-20 NOTE — Assessment & Plan Note (Signed)
Still reporting elevated BPs. No changes in amlodipine 5 mg. He will continue Cozaar 100 mg daily, HCTZ 25 mg added today, so Hyzaar 100-25 mg sent to the pharmacy. We discussed side effects of medications.  BMP to be done 05/26/2018.

## 2018-05-20 NOTE — Assessment & Plan Note (Signed)
He is reporting adequately controlled BS. He has not interested in pharmacologic treatment. He is not sure about next appointment with Dr. Buddy Duty, advise to be sure of following as recommended.

## 2018-05-20 NOTE — Assessment & Plan Note (Signed)
He is not on pharmacologic treatment. For now continue low-fat diet. He would benefit from statins, recommendations in regard to options will be given according to lipid panel results.

## 2018-05-20 NOTE — Progress Notes (Signed)
Virtual Visit via Telephone Note  I connected with Eric Adams on 05/20/18 at  4:00 PM EDT by telephone and verified that I am speaking with the correct person using two identifiers.   I discussed the limitations, risks, security and privacy concerns of performing an evaluation and management service by telephone and the availability of in person appointments. I also discussed with the patient that there may be a patient responsible charge related to this service. The patient expressed understanding and agreed to proceed.  Location patient: home Location provider: office Participants present for the call: patient, provider Patient did not have a visit in the prior 7 days to address this/these issue(s).   History of Present Illness: Eric Adams is a 48 yo AA male with history of hypertension, hyperlipidemia, fatty liver disease, and DM 2. He follows with Dr. care, endocrinologist.  He has not been taking metformin 1000 mg twice daily for the past 3 months because he feels like her BS are "good." He was having some diarrhea in 07/2017,per Dr Cindra Eves records.  BS 110-120's. Denies abdominal pain, nausea,vomiting, polydipsia,polyuria, or polyphagia.  Not sure about las HgA1C number.  HTN: He is currently on amlodipine 5 mg daily and losartan 100 mg daily. BP readings at home is still elevated, most of the time >140/90. Today 146/96. He is tolerating medication well. Denies headache, chest pain, dyspnea, palpitations, focal deficit, or edema. Echo done on 04/01/2018, low normal LV systolic function (40-97%), mild LVH, and mild diastolic dysfunction. He denies orthopnea or PND.  Hyperlipidemia, currently he is on nonpharmacologic treatment. Last lipid panel done in 07/2017: TC 241, TG 359, LDL 125, HDL 45.    Observations/Objective: Patient sounds cheerful and well on the phone. I do not appreciate any SOB. Speech and thought processing are grossly intact. Patient reported vitals:BP  (!) 146/96   Pulse 72    ASSESSMENT AND PLAN:  Discussed the following assessment and plan:  Orders Placed This Encounter  Procedures  . Comprehensive metabolic panel  . Lipid panel    Benign essential hypertension Still reporting elevated BPs. No changes in amlodipine 5 mg. He will continue Cozaar 100 mg daily, HCTZ 25 mg added today, so Hyzaar 100-25 mg sent to the pharmacy. We discussed side effects of medications.  BMP to be done 05/26/2018.  Controlled diabetes mellitus type 2 with complications [facility]) He is reporting adequately controlled BS. He has not interested in pharmacologic treatment. He is not sure about next appointment with Eric Adams, advise to be sure of following as recommended.   Hyperlipidemia associated with type 2 diabetes mellitus ([place]) He is not on pharmacologic treatment. For now continue low-fat diet. He would benefit from statins, recommendations in regard to options will be given according to lipid panel results.  Is coming for fasting labs on 3/31 2020 at 7:45 AM.  I would like to see him back in 3 to 4 months.    I discussed the assessment and treatment plan with the patient. The patient was provided an opportunity to ask questions and all were answered. The patient agreed with the plan and demonstrated an understanding of the instructions.   The patient was advised to call back or seek an in-person evaluation if the symptoms worsen or if the condition fails to improve as anticipated.  I provided 22 minutes of non-face-to-face time during this encounter.   Betty Martinique, MD

## 2018-05-20 NOTE — Telephone Encounter (Signed)
05/20/2018 11:37am pt wife returned call. Pt does not have email access during the day but can be available for a telephone call. Pt is needing to f/u with Dr. Martinique regarding the added BP med at last OV. Home cuff seemed normal to wife. Please call pt at [phone] if ok for phone visit.    Copied from [place] [phone]. Topic: Conservator, museum/gallery Patient (Clinic Use ONLY) >> May 19, 2018  1:07 PM Zacarias Pontes, CMA wrote: Reason for CRM: called patient to see if he would like to do a virtual visit or reschedule for a later date.

## 2018-05-20 NOTE — Telephone Encounter (Signed)
Message sent to Dr. Martinique for review and approval. Patient for today at 4 pm.

## 2018-05-21 ENCOUNTER — Telehealth: Payer: Self-pay | Admitting: Internal Medicine

## 2018-05-21 NOTE — Telephone Encounter (Signed)
Pt's wife calling (on [place]) to clarify med changes made at appt yesterday. Reviewed medications and dosages to her satisfaction, verbalizes understanding.

## 2018-05-25 NOTE — Progress Notes (Deleted)
Called pt for pre screen for lab no answer messaged left

## 2018-05-26 ENCOUNTER — Other Ambulatory Visit: Payer: Commercial Managed Care - PPO

## 2018-06-19 DIAGNOSIS — Z5181 Encounter for therapeutic drug level monitoring: Secondary | ICD-10-CM | POA: Diagnosis not present

## 2018-06-19 DIAGNOSIS — Z23 Encounter for immunization: Secondary | ICD-10-CM | POA: Diagnosis not present

## 2018-06-19 DIAGNOSIS — E119 Type 2 diabetes mellitus without complications: Secondary | ICD-10-CM | POA: Diagnosis not present

## 2018-06-22 DIAGNOSIS — I10 Essential (primary) hypertension: Secondary | ICD-10-CM | POA: Diagnosis not present

## 2018-06-22 DIAGNOSIS — R799 Abnormal finding of blood chemistry, unspecified: Secondary | ICD-10-CM | POA: Diagnosis not present

## 2018-06-22 DIAGNOSIS — D509 Iron deficiency anemia, unspecified: Secondary | ICD-10-CM | POA: Diagnosis not present

## 2018-06-22 DIAGNOSIS — E119 Type 2 diabetes mellitus without complications: Secondary | ICD-10-CM | POA: Diagnosis not present

## 2018-06-30 ENCOUNTER — Other Ambulatory Visit: Payer: 59

## 2018-07-01 DIAGNOSIS — N289 Disorder of kidney and ureter, unspecified: Secondary | ICD-10-CM | POA: Diagnosis not present

## 2018-07-09 DIAGNOSIS — Z79899 Other long term (current) drug therapy: Secondary | ICD-10-CM | POA: Diagnosis not present

## 2018-07-22 DIAGNOSIS — E119 Type 2 diabetes mellitus without complications: Secondary | ICD-10-CM | POA: Diagnosis not present

## 2018-07-22 DIAGNOSIS — D582 Other hemoglobinopathies: Secondary | ICD-10-CM | POA: Diagnosis not present

## 2018-08-13 ENCOUNTER — Encounter: Payer: Self-pay | Admitting: Internal Medicine

## 2019-08-31 ENCOUNTER — Encounter: Payer: Self-pay | Admitting: Internal Medicine

## 2019-09-23 ENCOUNTER — Ambulatory Visit: Payer: 59 | Admitting: Physician Assistant

## 2019-10-12 ENCOUNTER — Ambulatory Visit (AMBULATORY_SURGERY_CENTER): Payer: Self-pay | Admitting: *Deleted

## 2019-10-12 ENCOUNTER — Other Ambulatory Visit: Payer: Self-pay

## 2019-10-12 VITALS — Ht 66.0 in | Wt 250.2 lb

## 2019-10-12 DIAGNOSIS — Z86010 Personal history of colonic polyps: Secondary | ICD-10-CM

## 2019-10-12 MED ORDER — SUTAB 1479-225-188 MG PO TABS: 1.0000 | ORAL_TABLET | ORAL | 0 refills | Status: DC

## 2019-10-12 NOTE — Progress Notes (Signed)
Patient denies any allergies to egg or soy products. Patient denies complications with anesthesia/sedation.  Patient dx with OSA, uses CPAP nightly.  Patient denies oxygen use at home and denies diet medications. Patient denied Emmi instructions for colonoscopy procedure.  Patient is fully vaccinated, last covid injection on 05/30/19.

## 2019-10-13 ENCOUNTER — Encounter: Payer: Self-pay | Admitting: Internal Medicine

## 2019-10-26 ENCOUNTER — Ambulatory Visit (AMBULATORY_SURGERY_CENTER): Payer: BC Managed Care – PPO | Admitting: Internal Medicine

## 2019-10-26 ENCOUNTER — Other Ambulatory Visit: Payer: Self-pay

## 2019-10-26 ENCOUNTER — Encounter: Payer: Self-pay | Admitting: Internal Medicine

## 2019-10-26 VITALS — BP 123/78 | HR 53 | Temp 97.9°F | Resp 20 | Ht 66.0 in | Wt 250.2 lb

## 2019-10-26 DIAGNOSIS — D120 Benign neoplasm of cecum: Secondary | ICD-10-CM

## 2019-10-26 DIAGNOSIS — D128 Benign neoplasm of rectum: Secondary | ICD-10-CM | POA: Diagnosis not present

## 2019-10-26 DIAGNOSIS — Z86010 Personal history of colonic polyps: Secondary | ICD-10-CM | POA: Diagnosis not present

## 2019-10-26 DIAGNOSIS — K621 Rectal polyp: Secondary | ICD-10-CM | POA: Diagnosis not present

## 2019-10-26 DIAGNOSIS — Z1211 Encounter for screening for malignant neoplasm of colon: Secondary | ICD-10-CM | POA: Diagnosis not present

## 2019-10-26 MED ORDER — SODIUM CHLORIDE 0.9 % IV SOLN: 500.0000 mL | Freq: Once | INTRAVENOUS | Status: AC

## 2019-10-26 NOTE — Progress Notes (Signed)
Pt. Reports no change in his medical or surgical history since his pre-visit 10/12/2019.

## 2019-10-26 NOTE — Patient Instructions (Signed)
Discharge instructions given. ?Handouts on polyps and Hemorrhoids. ?Resume previous medications. ?YOU HAD AN ENDOSCOPIC PROCEDURE TODAY AT THE  ENDOSCOPY CENTER:   Refer to the procedure report that was given to you for any specific questions about what was found during the examination.  If the procedure report does not answer your questions, please call your gastroenterologist to clarify.  If you requested that your care partner not be given the details of your procedure findings, then the procedure report has been included in a sealed envelope for you to review at your convenience later. ? ?YOU SHOULD EXPECT: Some feelings of bloating in the abdomen. Passage of more gas than usual.  Walking can help get rid of the air that was put into your GI tract during the procedure and reduce the bloating. If you had a lower endoscopy (such as a colonoscopy or flexible sigmoidoscopy) you may notice spotting of blood in your stool or on the toilet paper. If you underwent a bowel prep for your procedure, you may not have a normal bowel movement for a few days. ? ?Please Note:  You might notice some irritation and congestion in your nose or some drainage.  This is from the oxygen used during your procedure.  There is no need for concern and it should clear up in a day or so. ? ?SYMPTOMS TO REPORT IMMEDIATELY: ? ?Following lower endoscopy (colonoscopy or flexible sigmoidoscopy): ? Excessive amounts of blood in the stool ? Significant tenderness or worsening of abdominal pains ? Swelling of the abdomen that is new, acute ? Fever of 100?F or higher ? ? ?For urgent or emergent issues, a gastroenterologist can be reached at any hour by calling [phone]. ?Do not use MyChart messaging for urgent concerns.  ? ? ?DIET:  We do recommend a small meal at first, but then you may proceed to your regular diet.  Drink plenty of fluids but you should avoid alcoholic beverages for 24 hours. ? ?ACTIVITY:  You should plan to take it  easy for the rest of today and you should NOT DRIVE or use heavy machinery until tomorrow (because of the sedation medicines used during the test).   ? ?FOLLOW UP: ?Our staff will call the number listed on your records 48-72 hours following your procedure to check on you and address any questions or concerns that you may have regarding the information given to you following your procedure. If we do not reach you, we will leave a message.  We will attempt to reach you two times.  During this call, we will ask if you have developed any symptoms of COVID 19. If you develop any symptoms (ie: fever, flu-like symptoms, shortness of breath, cough etc.) before then, please call [phone].  If you test positive for Covid 19 in the 2 weeks post procedure, please call and report this information to us.   ? ?If any biopsies were taken you will be contacted by phone or by letter within the next 1-3 weeks.  Please call us at [phone] if you have not heard about the biopsies in 3 weeks.  ? ? ?SIGNATURES/CONFIDENTIALITY: ?You and/or your care partner have signed paperwork which will be entered into your electronic medical record.  These signatures attest to the fact that that the information above on your After Visit Summary has been reviewed and is understood.  Full responsibility of the confidentiality of this discharge information lies with you and/or your care-partner.  ?

## 2019-10-26 NOTE — Progress Notes (Signed)
A and O x3. Report to RN. Tolerated MAC anesthesia well.

## 2019-10-26 NOTE — Progress Notes (Signed)
robiol antisialogogue  Lidocaine buffer

## 2019-10-26 NOTE — Op Note (Signed)
[place] Patient Name: [name] Procedure Date: 10/26/2019 8:03 AM MRN: [id number] Endoscopist: Jerene Bears , MD Age: 49 Referring MD:  Date of Birth: Dec 09, 1970 Gender: Male Account #: [id number] Procedure:                Colonoscopy Indications:              High risk colon cancer surveillance: Personal                            history of multiple (3 or more) adenomas, Last                            colonoscopy: June 2017 Medicines:                Monitored Anesthesia Care Procedure:                Pre-Anesthesia Assessment:                           - Prior to the procedure, a History and Physical                            was performed, and patient medications and                            allergies were reviewed. The patient's tolerance of                            previous anesthesia was also reviewed. The risks                            and benefits of the procedure and the sedation                            options and risks were discussed with the patient.                            All questions were answered, and informed consent                            was obtained. Prior Anticoagulants: The patient has                            taken no previous anticoagulant or antiplatelet                            agents except for aspirin. ASA Grade Assessment:                            III - A patient with severe systemic disease. After                            reviewing the risks and benefits, the patient was  deemed in satisfactory condition to undergo the                            procedure.                           After obtaining informed consent, the colonoscope                            was passed under direct vision. Throughout the                            procedure, the patient's blood pressure, pulse, and                            oxygen saturations were monitored continuously. The                             Colonoscope was introduced through the anus and                            advanced to the cecum, identified by appendiceal                            orifice and ileocecal valve. The colonoscopy was                            performed without difficulty. The patient tolerated                            the procedure well. The quality of the bowel                            preparation was good. The ileocecal valve,                            appendiceal orifice, and rectum were photographed. Scope In: 2:44:01 AM Scope Out: 8:20:53 AM Scope Withdrawal Time: 0 hours 11 minutes 46 seconds  Total Procedure Duration: 0 hours 14 minutes 9 seconds  Findings:                 The digital rectal exam was normal.                           A 2 mm polyp was found in the cecum. The polyp was                            sessile. The polyp was removed with a cold biopsy                            forceps. Resection and retrieval were complete.                           A 1 mm polyp was found in the proximal rectum. The  polyp was sessile. The polyp was removed with a                            cold biopsy forceps. Resection and retrieval were                            complete.                           Internal hemorrhoids were found during                            retroflexion. The hemorrhoids were small.                           The exam was otherwise without abnormality. Complications:            No immediate complications. Estimated Blood Loss:     Estimated blood loss was minimal. Impression:               - One 2 mm polyp in the cecum, removed with a cold                            biopsy forceps. Resected and retrieved.                           - One 1 mm polyp in the proximal rectum, removed                            with a cold biopsy forceps. Resected and retrieved.                           - Small internal hemorrhoids.                           - The  examination was otherwise normal. Recommendation:           - Patient has a contact number available for                            emergencies. The signs and symptoms of potential                            delayed complications were discussed with the                            patient. Return to normal activities tomorrow.                            Written discharge instructions were provided to the                            patient.                           - Resume previous diet.                           -  Continue present medications.                           - Await pathology results.                           - Repeat colonoscopy in 5 years for surveillance. Jerene Bears, MD 10/26/2019 8:46:13 AM This report has been signed electronically.

## 2019-10-27 ENCOUNTER — Ambulatory Visit: Payer: 59 | Admitting: Internal Medicine

## 2019-10-28 ENCOUNTER — Telehealth: Payer: Self-pay | Admitting: *Deleted

## 2019-10-28 NOTE — Telephone Encounter (Signed)
  Follow up Call-  Call back number 10/26/2019  Post procedure Call Back phone  # [phone]  Permission to leave phone message Yes  Some recent data might be hidden     Patient questions:  Do you have a fever, pain , or abdominal swelling? No. Pain Score  0 *  Have you tolerated food without any problems? Yes.    Have you been able to return to your normal activities? Yes.    Do you have any questions about your discharge instructions: Diet   No. Medications  No. Follow up visit  No.  Do you have questions or concerns about your Care? No.  Actions: * If pain score is 4 or above: No action needed, pain <4.  1. Have you developed a fever since your procedure? no  2.   Have you had an respiratory symptoms (SOB or cough) since your procedure? no  3.   Have you tested positive for COVID 19 since your procedure no  4.   Have you had any family members/close contacts diagnosed with the COVID 19 since your procedure?  no   If yes to any of these questions please route to Joylene John, RN and Joella Prince, RN

## 2019-10-29 ENCOUNTER — Encounter: Payer: Self-pay | Admitting: Internal Medicine

## 2020-03-22 DIAGNOSIS — H40023 Open angle with borderline findings, high risk, bilateral: Secondary | ICD-10-CM | POA: Diagnosis not present

## 2020-03-28 DIAGNOSIS — E1165 Type 2 diabetes mellitus with hyperglycemia: Secondary | ICD-10-CM | POA: Diagnosis not present

## 2020-03-28 DIAGNOSIS — R799 Abnormal finding of blood chemistry, unspecified: Secondary | ICD-10-CM | POA: Diagnosis not present

## 2020-03-28 DIAGNOSIS — E119 Type 2 diabetes mellitus without complications: Secondary | ICD-10-CM | POA: Diagnosis not present

## 2020-03-28 DIAGNOSIS — Z79899 Other long term (current) drug therapy: Secondary | ICD-10-CM | POA: Diagnosis not present

## 2020-03-28 DIAGNOSIS — D582 Other hemoglobinopathies: Secondary | ICD-10-CM | POA: Diagnosis not present

## 2020-03-28 DIAGNOSIS — I10 Essential (primary) hypertension: Secondary | ICD-10-CM | POA: Diagnosis not present

## 2020-05-15 DIAGNOSIS — E669 Obesity, unspecified: Secondary | ICD-10-CM | POA: Diagnosis not present

## 2020-05-15 DIAGNOSIS — G4733 Obstructive sleep apnea (adult) (pediatric): Secondary | ICD-10-CM | POA: Diagnosis not present

## 2020-05-15 DIAGNOSIS — I10 Essential (primary) hypertension: Secondary | ICD-10-CM | POA: Diagnosis not present

## 2020-05-15 DIAGNOSIS — F5112 Insufficient sleep syndrome: Secondary | ICD-10-CM | POA: Diagnosis not present

## 2020-06-26 DIAGNOSIS — E1165 Type 2 diabetes mellitus with hyperglycemia: Secondary | ICD-10-CM | POA: Diagnosis not present

## 2020-06-26 DIAGNOSIS — I10 Essential (primary) hypertension: Secondary | ICD-10-CM | POA: Diagnosis not present

## 2020-06-26 DIAGNOSIS — R799 Abnormal finding of blood chemistry, unspecified: Secondary | ICD-10-CM | POA: Diagnosis not present

## 2020-06-26 DIAGNOSIS — D582 Other hemoglobinopathies: Secondary | ICD-10-CM | POA: Diagnosis not present

## 2020-07-19 DIAGNOSIS — H40053 Ocular hypertension, bilateral: Secondary | ICD-10-CM | POA: Diagnosis not present

## 2020-08-24 DIAGNOSIS — G4733 Obstructive sleep apnea (adult) (pediatric): Secondary | ICD-10-CM | POA: Diagnosis not present

## 2020-08-24 DIAGNOSIS — I10 Essential (primary) hypertension: Secondary | ICD-10-CM | POA: Diagnosis not present

## 2020-11-04 ENCOUNTER — Emergency Department (HOSPITAL_COMMUNITY): Payer: BC Managed Care – PPO

## 2020-11-04 ENCOUNTER — Encounter (HOSPITAL_COMMUNITY): Payer: Self-pay | Admitting: Emergency Medicine

## 2020-11-04 ENCOUNTER — Emergency Department (HOSPITAL_COMMUNITY)
Admission: EM | Admit: 2020-11-04 | Discharge: 2020-11-04 | Disposition: A | Discharge status: Home / Self Care | Payer: BC Managed Care – PPO | Attending: Emergency Medicine | Admitting: Emergency Medicine

## 2020-11-04 ENCOUNTER — Other Ambulatory Visit: Payer: Self-pay

## 2020-11-04 ENCOUNTER — Ambulatory Visit: Payer: Self-pay

## 2020-11-04 DIAGNOSIS — J353 Hypertrophy of tonsils with hypertrophy of adenoids: Secondary | ICD-10-CM | POA: Diagnosis not present

## 2020-11-04 DIAGNOSIS — I10 Essential (primary) hypertension: Secondary | ICD-10-CM | POA: Diagnosis not present

## 2020-11-04 DIAGNOSIS — Z20822 Contact with and (suspected) exposure to covid-19: Secondary | ICD-10-CM | POA: Insufficient documentation

## 2020-11-04 DIAGNOSIS — Z79899 Other long term (current) drug therapy: Secondary | ICD-10-CM | POA: Diagnosis not present

## 2020-11-04 DIAGNOSIS — M50323 Other cervical disc degeneration at C6-C7 level: Secondary | ICD-10-CM | POA: Diagnosis not present

## 2020-11-04 DIAGNOSIS — R070 Pain in throat: Secondary | ICD-10-CM | POA: Diagnosis not present

## 2020-11-04 DIAGNOSIS — Z7984 Long term (current) use of oral hypoglycemic drugs: Secondary | ICD-10-CM | POA: Insufficient documentation

## 2020-11-04 DIAGNOSIS — J029 Acute pharyngitis, unspecified: Secondary | ICD-10-CM

## 2020-11-04 DIAGNOSIS — R590 Localized enlarged lymph nodes: Secondary | ICD-10-CM | POA: Diagnosis not present

## 2020-11-04 DIAGNOSIS — E119 Type 2 diabetes mellitus without complications: Secondary | ICD-10-CM | POA: Insufficient documentation

## 2020-11-04 LAB — CBC WITH DIFFERENTIAL/PLATELET
Abs Immature Granulocytes: 0.06 10*3/uL (ref 0.00–0.07)
Basophils Absolute: 0.1 10*3/uL (ref 0.0–0.1)
Basophils Relative: 1 %
Eosinophils Absolute: 0 10*3/uL (ref 0.0–0.5)
Eosinophils Relative: 0 %
HCT: 42.2 % (ref 39.0–52.0)
Hemoglobin: 14.8 g/dL (ref 13.0–17.0)
Immature Granulocytes: 1 %
Lymphocytes Relative: 25 %
Lymphs Abs: 2.4 10*3/uL (ref 0.7–4.0)
MCH: 27.9 pg (ref 26.0–34.0)
MCHC: 35.1 g/dL (ref 30.0–36.0)
MCV: 79.6 fL — ABNORMAL LOW (ref 80.0–100.0)
Monocytes Absolute: 1.3 10*3/uL — ABNORMAL HIGH (ref 0.1–1.0)
Monocytes Relative: 13 %
Neutro Abs: 5.9 10*3/uL (ref 1.7–7.7)
Neutrophils Relative %: 60 %
Platelets: 261 10*3/uL (ref 150–400)
RBC: 5.3 MIL/uL (ref 4.22–5.81)
RDW: 13.3 % (ref 11.5–15.5)
WBC: 9.8 10*3/uL (ref 4.0–10.5)
nRBC: 0 % (ref 0.0–0.2)

## 2020-11-04 LAB — BASIC METABOLIC PANEL
Anion gap: 6 (ref 5–15)
BUN: 8 mg/dL (ref 6–20)
CO2: 29 mmol/L (ref 22–32)
Calcium: 9 mg/dL (ref 8.9–10.3)
Chloride: 103 mmol/L (ref 98–111)
Creatinine, Ser: 1.17 mg/dL (ref 0.61–1.24)
GFR, Estimated: >60 mL/min (ref >60)
Glucose, Bld: 160 mg/dL — ABNORMAL HIGH (ref 70–99)
Potassium: 3.6 mmol/L (ref 3.5–5.1)
Sodium: 138 mmol/L (ref 135–145)

## 2020-11-04 LAB — SARS CORONAVIRUS 2 (TAT 6-24 HRS): SARS Coronavirus 2: NEGATIVE

## 2020-11-04 LAB — GROUP A STREP BY PCR: Group A Strep by PCR: NOT DETECTED

## 2020-11-04 MED ORDER — IOHEXOL 350 MG/ML SOLN
100.0000 mL | Freq: Once | INTRAVENOUS | Status: AC | PRN
  Administered 2020-11-04: 100 mL via INTRAVENOUS

## 2020-11-04 MED ORDER — DEXAMETHASONE SODIUM PHOSPHATE 10 MG/ML IJ SOLN
10.0000 mg | Freq: Once | INTRAMUSCULAR | Status: AC
  Administered 2020-11-04: 10 mg via INTRAVENOUS
  Filled 2020-11-04: qty 1

## 2020-11-04 MED ORDER — MORPHINE SULFATE (PF) 4 MG/ML IV SOLN
4.0000 mg | Freq: Once | INTRAVENOUS | Status: AC
Start: 2020-11-04 — End: 2020-11-04
  Administered 2020-11-04: 4 mg via INTRAVENOUS
  Filled 2020-11-04: qty 1

## 2020-11-04 MED ORDER — AMOXICILLIN-POT CLAVULANATE 875-125 MG PO TABS
1.0000 | ORAL_TABLET | Freq: Two times a day (BID) | ORAL | 0 refills | Status: DC
Start: 2020-11-04 — End: 2020-11-21

## 2020-11-04 NOTE — ED Provider Notes (Signed)
Care transferred to me.  Patient is hemodynamically stable.  Labs are unremarkable.  CT scan does not show an obvious peritonsillar abscess though does show bilateral tonsillar swelling.  He will be given Decadron given his degree of discomfort.  He was told to have caution about his glucose due to this.  He also appears to have a possible pneumonia on his CT scan.  My suspicion is this is probably all COVID but the COVID test sent will not result for several hours.  I will send him a prescription of Augmentin that he can take if his test is negative but otherwise he would just need supportive care.  Either way he appears stable for discharge home.   Sherwood Gambler, MD 11/04/20 [phone]

## 2020-11-04 NOTE — ED Provider Notes (Signed)
[facility] EMERGENCY DEPARTMENT Provider Note   CSN: [id number] Arrival date & time: 11/04/20  [id number]     History No chief complaint on file.   Eric Adams is a 50 y.o. male.  The history is provided by the patient.  Sore Throat This is a new problem. The current episode started more than 2 days ago. The problem occurs constantly. The problem has not changed since onset.Pertinent negatives include no chest pain, no abdominal pain, no headaches and no shortness of breath. The symptoms are aggravated by swallowing. Nothing relieves the symptoms.   50 year old male presenting to the emergency department with a complaint of sore throat, fever at home, diffuse myalgias for the past 3 days.  He had a home COVID test which resulted negative.  He does feel like there is some asymmetry to his sore throat with worsening swelling on the right.  He denies any difficulty breathing.  He endorses pain on swallowing.  Past Medical History:  Diagnosis Date   Diabetes mellitus without complication ([place])    H. pylori infection    Hepatic steatosis    Hyperlipidemia    no med currently   Hypertension    no med currently   Sleep apnea    uses cpap nightly   Tubular adenoma of colon     Patient Active Problem List   Diagnosis Date Noted   Benign essential hypertension 05/20/2018   Hyperlipidemia associated with type 2 diabetes mellitus ([place]) 05/20/2018   Morbid obesity ([place]) 08/30/2014   Controlled diabetes mellitus type 2 with complications ([place]) 0000000   Obstructive sleep apnea 09/02/2013   H. pylori infection 03/22/2013   Fatty liver disease, nonalcoholic [id number]    Past Surgical History:  Procedure Laterality Date   BREATH TEK H PYLORI N/A 12/21/2012   Procedure: BREATH Eliezer Champagne;  Surgeon: Jerene Bears, MD;  Location: [facility] ENDOSCOPY;  Service: Gastroenterology;  Laterality: N/A;   COLONOSCOPY  07/2015   PYRTLE - TA X 3   FOOT SURGERY Right         Family History  Problem Relation Age of Onset   Colon cancer Neg Hx    Diabetes Neg Hx    Heart disease Neg Hx    Hypertension Neg Hx    Kidney disease Neg Hx    Rectal cancer Neg Hx    Stomach cancer Neg Hx     Social History   Tobacco Use   Smoking status: Never   Smokeless tobacco: Never  Vaping Use   Vaping Use: Never used  Substance Use Topics   Alcohol use: Yes    Comment: glass of wine occasionally   Drug use: No    Home Medications Prior to Admission medications   Medication Sig Start Date End Date Taking? Authorizing Provider  albuterol (PROVENTIL HFA;VENTOLIN HFA) 108 (90 Base) MCG/ACT inhaler Inhale 2 puffs into the lungs every 6 (six) hours as needed for up to 30 days for wheezing or shortness of breath. 03/11/18 04/10/18  [place], Betty G, MD  amLODipine (NORVASC) 5 MG tablet Take 1 tablet (5 mg total) by mouth daily. 04/22/18   [place], Betty G, MD  losartan-hydrochlorothiazide (HYZAAR) 100-25 MG tablet Take 1 tablet by mouth daily. Patient not taking: Reported on 10/12/2019 05/20/18   [place], Betty G, MD  metFORMIN (GLUCOPHAGE) 1000 MG tablet 1/2 am and 1 pcs Patient not taking: Reported on 10/12/2019 12/07/14   Elayne Snare, MD  ONE TOUCH LANCETS MISC USE TO  CHECK BLOOD SUGAR TWICE A DAY AND PRN 08/30/14   Marletta Lor, MD  [facility] VERIO test strip USE TO CHECK BLOOD SUGAR TWICE A DAY AND AS NEEDED 06/25/17   Marletta Lor, MD    Allergies    Patient has no known allergies.  Review of Systems   Review of Systems  Constitutional:  Positive for fever.  HENT:  Positive for sore throat and trouble swallowing. Negative for ear pain and voice change.   Eyes:  Negative for pain and visual disturbance.  Respiratory:  Negative for cough and shortness of breath.   Cardiovascular:  Negative for chest pain and palpitations.  Gastrointestinal:  Negative for abdominal pain and vomiting.  Genitourinary:  Negative for dysuria and hematuria.   Musculoskeletal:  Positive for myalgias. Negative for arthralgias and back pain.  Skin:  Negative for color change and rash.  Neurological:  Negative for seizures, syncope and headaches.  All other systems reviewed and are negative.  Physical Exam Updated Vital Signs BP (!) 158/96 (BP Location: Left Arm)   Pulse 81   Temp 99.4 F (37.4 C) (Oral)   Resp 20   SpO2 94%   Physical Exam Vitals and nursing note reviewed.  Constitutional:      General: He is not in acute distress. HENT:     Head: Normocephalic and atraumatic.     Mouth/Throat:     Comments: Bilateral oropharyngeal erythema without clear asymmetry.  No evidence of PTA or RPA. Eyes:     Conjunctiva/sclera: Conjunctivae normal.     Pupils: Pupils are equal, round, and reactive to light.  Cardiovascular:     Rate and Rhythm: Normal rate and regular rhythm.  Pulmonary:     Effort: Pulmonary effort is normal. No respiratory distress.     Breath sounds: Normal breath sounds.  Abdominal:     General: There is no distension.     Tenderness: There is no guarding.  Musculoskeletal:        General: No deformity or signs of injury.     Cervical back: Normal range of motion and neck supple.  Skin:    Findings: No lesion or rash.  Neurological:     General: No focal deficit present.     Mental Status: He is alert. Mental status is at baseline.    ED Results / Procedures / Treatments   Labs (all labs ordered are listed, but only abnormal results are displayed) Labs Reviewed  CBC WITH DIFFERENTIAL/PLATELET - Abnormal; Notable for the following components:      Result Value   MCV 79.6 (*)    Monocytes Absolute 1.3 (*)    All other components within normal limits  BASIC METABOLIC PANEL - Abnormal; Notable for the following components:   Glucose, Bld 160 (*)    All other components within normal limits  GROUP A STREP BY PCR  SARS CORONAVIRUS 2 (TAT 6-24 HRS)    EKG None  Radiology CT Soft Tissue Neck W  Contrast  Result Date: 11/04/2020 CLINICAL DATA:  Tonsil/adenoid disorder Eval for PTA EXAM: CT NECK WITH CONTRAST TECHNIQUE: Multidetector CT imaging of the neck was performed using the standard protocol following the bolus administration of intravenous contrast. CONTRAST:  1103m OMNIPAQUE IOHEXOL 350 MG/ML SOLN COMPARISON:  None. FINDINGS: Pharynx and larynx: Enlarged tonsils and adenoids with narrowing of the nasopharynx. No discrete drainable fluid collection or mass. Normal appearance of the epiglottis. No significant retropharyngeal edema. Salivary glands: No inflammation, mass, or stone. Thyroid:  Normal. Lymph nodes: Mildly enlarged cervical chain lymph nodes, including a few borderline enlarged nodes. No suppurative nodes. Vascular: No obvious abnormality on this non arterial times study. Limited intracranial: Negative. Visualized orbits: Negative. Mastoids and visualized paranasal sinuses: Moderate mucosal thickening of the inferior maxillary sinuses and ethmoid air cells with mild to moderate bilateral sphenoid sinus mucosal thickening. Skeleton: Moderate degenerative disc disease at C6-C7. Upper chest: Motion limited evaluation with patchy airspace opacities in the lateral right upper lung IMPRESSION: 1. Enlarged tonsils and adenoids with narrowing of the nasopharynx, possibly related to tonsillitis/pharyngitis given the clinical history. No discrete drainable fluid collection. 2. Patchy airspace opacities in the partially imaged lateral right upper lung, suspicious for pneumonia. 3. Borderline enlarged bilateral cervical chain lymph nodes, nonspecific but potentially reactive given the above findings. 4. Moderate paranasal sinus mucosal thickening. Electronically Signed   By: Margaretha Sheffield M.D.   On: 11/04/2020 17:42    Procedures Procedures   Medications Ordered in ED Medications  morphine 4 MG/ML injection 4 mg (4 mg Intravenous Given 11/04/20 1603)  iohexol (OMNIPAQUE) 350 MG/ML injection  100 mL (100 mLs Intravenous Contrast Given 11/04/20 1702)    ED Course  I have reviewed the triage vital signs and the nursing notes.  Pertinent labs & imaging results that were available during my care of the patient were reviewed by me and considered in my medical decision making (see chart for details).    MDM Rules/Calculators/A&P                           50 year old male presenting to the emergency department with a complaint of sore throat, fever at home, diffuse myalgias for the past 3 days.  He had a home COVID test which resulted negative.  He does feel like there is some asymmetry to his sore throat with worsening swelling on the right.  He denies any difficulty breathing.  He endorses pain on swallowing.  On arrival, the patient was afebrile, hemodynamically stable, ABC intact.  No evidence of clear PTA or RPA on exam.  The patient has good range of motion of the neck.  He presents with a flulike syndrome with sore throat, fever, myalgias for the past 3 days.  He does endorse asymmetry with right-sided swelling noted according to the patient.  No clear trismus.  No clear PTA on visual inspection and palpation.  Will obtain CT of the neck with contrast to further evaluate for PTA.  COVID-19 and influenza PCR testing collected and pending.  Labs to include CBC and BMP pending.  Strep screen collected and resulted negative.  Plan at time of signout to follow-up the patient's labs and imaging.  Signout given to Dr. Regenia Skeeter at [phone].  Final Clinical Impression(s) / ED Diagnoses Final diagnoses:  Sore throat    Rx / DC Orders ED Discharge Orders     None        Regan Lemming, MD 11/04/20 [phone]

## 2020-11-04 NOTE — Discharge Instructions (Addendum)
If you develop high fever, severe cough or cough with blood, trouble breathing, severe headache, neck pain/stiffness, vomiting, or any other new/concerning symptoms then return to the ER for evaluation   Be sure to take ibuprofen and/or Tylenol for discomfort.  You were given steroid here for the tonsillar swelling, be sure to watch your sugars closely as they would likely go up somewhat.  You may take melatonin to try and help with sleep.  Your COVID test is still pending.  If your COVID test is positive in your MyChart, likely all of this is from [place] and you should not take the antibiotics.  However if the COVID test is negative, proceed with the antibiotics prescribed.

## 2020-11-04 NOTE — ED Triage Notes (Addendum)
C/o sore throat and fever x 3 days.  Took negative home COVID test.  States he has been unable to sleep.

## 2020-11-09 ENCOUNTER — Telehealth (INDEPENDENT_AMBULATORY_CARE_PROVIDER_SITE_OTHER): Payer: BC Managed Care – PPO | Admitting: Family Medicine

## 2020-11-09 ENCOUNTER — Encounter: Payer: Self-pay | Admitting: Family Medicine

## 2020-11-09 VITALS — Temp 98.4°F

## 2020-11-09 DIAGNOSIS — R059 Cough, unspecified: Secondary | ICD-10-CM

## 2020-11-09 MED ORDER — BENZONATATE 200 MG PO CAPS: 200.0000 mg | ORAL_CAPSULE | Freq: Two times a day (BID) | ORAL | 0 refills | Status: DC | PRN

## 2020-11-09 NOTE — Patient Instructions (Addendum)
   ---------------------------------------------------------------------------------------------------------------------------      WORK SLIP:  Patient Eric Adams,  24-Jul-1970, was seen for a medical visit today, 11/09/20 . Please excuse from work for a COVID like illness. We advise resolution of fever for >24 hours and feeling better before return to work and advise wears good mask at all time once returns until all symptoms have resolved.   Sincerely: E-signature: Dr. Colin Benton, DO [place] Ph: [phone]   ------------------------------------------------------------------------------------------------------------------------------     -I sent the medication(s) we discussed to your pharmacy: Meds ordered this encounter  Medications   benzonatate (TESSALON) 200 MG capsule    Sig: Take 1 capsule (200 mg total) by mouth 2 (two) times daily as needed for cough.    Dispense:  20 capsule    Refill:  0   Please keep a follow up with your primary care doctor  I hope you are feeling better soon!  Seek in person care promptly if your symptoms worsen, new concerns arise or you are not improving with treatment.  It was nice to meet you today. I help [facility] out with telemedicine visits on Tuesdays and Thursdays and am available for visits on those days. If you have any concerns or questions following this visit please schedule a follow up visit with your Primary Care doctor or seek care at a local urgent care clinic to avoid delays in care.

## 2020-11-09 NOTE — Progress Notes (Signed)
Virtual Visit via Video Note  I connected with Eric Adams  on 11/09/20 at  3:00 PM EDT by a video enabled telemedicine application and verified that I am speaking with the correct person using two identifiers.  Location patient: home, [place] Location provider:work or home office Persons participating in the virtual visit: patient, provider  I discussed the limitations of evaluation and management by telemedicine and the availability of in person appointments. The patient expressed understanding and agreed to proceed.   HPI:  Acute telemedicine visit for : -Onset: 6 days -had test for covid, strep in ER, had CT was treated with steroids, Augmentin and a pain medication as had a little pneumonia on the lungs but that this was likely flu -Symptoms include:sore throat, fevers, body aches, cough -fevers resolved the last 48 hours and doing much better - reports the main symptom is just a cough and requests a cough medication -Denies:SOB, NVD, inability to eat/drink/get out of bed, known sick contacts -Pertinent past medical history: see below -Pertinent medication allergies: No Known Allergies -COVID-19 vaccine status:vaccinated and had 2 booster -reports has ER follow up with his PCP next week  ROS: See pertinent positives and negatives per HPI.  Past Medical History:  Diagnosis Date   Diabetes mellitus without complication ([facility])    H. pylori infection    Hepatic steatosis    Hyperlipidemia    no med currently   Hypertension    no med currently   Sleep apnea    uses cpap nightly   Tubular adenoma of colon     Past Surgical History:  Procedure Laterality Date   BREATH TEK H PYLORI N/A 12/21/2012   Procedure: BREATH TEK Kandis Ban;  Surgeon: Jerene Bears, MD;  Location: [facility];  Service: Gastroenterology;  Laterality: N/A;   COLONOSCOPY  07/2015   PYRTLE - TA X 3   FOOT SURGERY Right      Current Outpatient Medications:    amoxicillin-clavulanate (AUGMENTIN) 875-125 MG  tablet, Take 1 tablet by mouth 2 (two) times daily. One po bid x 7 days, Disp: 14 tablet, Rfl: 0   benzonatate (TESSALON) 200 MG capsule, Take 1 capsule (200 mg total) by mouth 2 (two) times daily as needed for cough., Disp: 20 capsule, Rfl: 0   ONE TOUCH LANCETS MISC, USE TO CHECK BLOOD SUGAR TWICE A DAY AND PRN, Disp: 100 each, Rfl: 6   ONETOUCH VERIO test strip, USE TO CHECK BLOOD SUGAR TWICE A DAY AND AS NEEDED, Disp: 100 each, Rfl: 0   TRULICITY [id number] SOPN, Inject into the skin., Disp: , Rfl:   Current Facility-Administered Medications:    0.9 %  sodium chloride infusion, 500 mL, Intravenous, Once, Pyrtle, Lajuan Lines, MD  EXAM:  VITALS per patient if applicable:  GENERAL: alert, oriented, appears well and in no acute distress  HEENT: atraumatic, conjunttiva clear, no obvious abnormalities on inspection of external nose and ears  NECK: normal movements of the head and neck  LUNGS: on inspection no signs of respiratory distress, breathing rate appears normal, no obvious gross SOB, gasping or wheezing  CV: no obvious cyanosis  MS: moves all visible extremities without noticeable abnormality  PSYCH/NEURO: pleasant and cooperative, no obvious depression or anxiety, speech and thought processing grossly intact  ASSESSMENT AND PLAN:  Discussed the following assessment and plan:  Cough  -we discussed possible serious and likely etiologies, options for evaluation and workup, limitations of telemedicine visit vs in person visit, treatment, treatment risks and precautions. Pt  is agreeable to treatment via telemedicine at this moment. Glad he is doing so much better. Advised completion of the abx and follow up with PCP as scheduled. Tessalon rxd for cough.  Work/School slipped offered: provided in patient instructions  Scheduled follow up with PCP offered: reports is scheduled for next week.  Advise to seek prompt in person care if worsening, new symptoms arise, or if is not improving  with treatment. Discussed options for care if PCP.  I discussed the assessment and treatment plan with the patient. The patient was provided an opportunity to ask questions and all were answered. The patient agreed with the plan and demonstrated an understanding of the instructions.     Lucretia Kern, DO

## 2020-11-15 ENCOUNTER — Other Ambulatory Visit: Payer: Self-pay

## 2020-11-15 ENCOUNTER — Ambulatory Visit: Payer: BC Managed Care – PPO | Admitting: Family Medicine

## 2020-11-15 ENCOUNTER — Encounter: Payer: Self-pay | Admitting: Family Medicine

## 2020-11-15 VITALS — BP 134/80 | HR 84 | Resp 16 | Ht 66.0 in | Wt 240.4 lb

## 2020-11-15 DIAGNOSIS — R0789 Other chest pain: Secondary | ICD-10-CM

## 2020-11-15 DIAGNOSIS — Z125 Encounter for screening for malignant neoplasm of prostate: Secondary | ICD-10-CM | POA: Diagnosis not present

## 2020-11-15 DIAGNOSIS — Z0000 Encounter for general adult medical examination without abnormal findings: Secondary | ICD-10-CM

## 2020-11-15 DIAGNOSIS — R059 Cough, unspecified: Secondary | ICD-10-CM

## 2020-11-15 DIAGNOSIS — Z23 Encounter for immunization: Secondary | ICD-10-CM | POA: Diagnosis not present

## 2020-11-15 DIAGNOSIS — E782 Mixed hyperlipidemia: Secondary | ICD-10-CM

## 2020-11-15 LAB — COMPREHENSIVE METABOLIC PANEL
ALT: 31 U/L (ref 0–53)
AST: 21 U/L (ref 0–37)
Albumin: 4.3 g/dL (ref 3.5–5.2)
Alkaline Phosphatase: 110 U/L (ref 39–117)
BUN: 11 mg/dL (ref 6–23)
CO2: 30 mEq/L (ref 19–32)
Calcium: 9.6 mg/dL (ref 8.4–10.5)
Chloride: 100 mEq/L (ref 96–112)
Creatinine, Ser: 1.06 mg/dL (ref 0.40–1.50)
GFR: 81.95 mL/min (ref >60.00)
Glucose, Bld: 133 mg/dL — ABNORMAL HIGH (ref 70–99)
Potassium: 4.1 mEq/L (ref 3.5–5.1)
Sodium: 138 mEq/L (ref 135–145)
Total Bilirubin: 0.9 mg/dL (ref 0.2–1.2)
Total Protein: 8.1 g/dL (ref 6.0–8.3)

## 2020-11-15 LAB — LIPID PANEL
Cholesterol: 168 mg/dL (ref 0–200)
HDL: 41 mg/dL (ref >39.00)
LDL Cholesterol: 95 mg/dL (ref 0–99)
NonHDL: 126.98
Total CHOL/HDL Ratio: 4
Triglycerides: 161 mg/dL — ABNORMAL HIGH (ref 0.0–149.0)
VLDL: 32.2 mg/dL (ref 0.0–40.0)

## 2020-11-15 LAB — PSA: PSA: 0.52 ng/mL (ref 0.10–4.00)

## 2020-11-15 NOTE — Patient Instructions (Signed)
A few things to remember from today's visit:   Routine general medical examination at a health care facility  Mixed hyperlipidemia - Plan: Comprehensive metabolic panel, Lipid panel  Prostate cancer screening - Plan: PSA  Cough  Chest wall pain  If you need refills please call your pharmacy. Do not use My Chart to request refills or for acute issues that need immediate attention.   Please be sure medication list is accurate. If a new problem present, please set up appointment sooner than planned today.  At least 150 minutes of moderate exercise per week, daily brisk walking for 15-30 min is a good exercise option. Healthy diet low in saturated (animal) fats and sweets and consisting of fresh fruits and vegetables, lean meats such as fish and white chicken and whole grains.  - Vaccines:  Tdap vaccine every 10 years.  Shingles vaccine recommended at age 46, could be given after 50 years of age but not sure about insurance coverage.  Pneumonia vaccines: Pneumovax at 67. Will give to you next visit because history of diabetes.  -Screening recommendations for low/normal risk males:  Colon cancer screening is now at age 81 but your insurance may not cover until age 71 .screening is recommended age 32.  Prostate cancer screening: some controversy, starts usually at 22: Rectal exam and PSA. Aortic Abdominal Aneurism once between 6 and 59 years old if ever smoker.  Also recommended:  Dental visit- Brush and floss your teeth twice daily; visit your dentist twice a year. Eye doctor- Get an eye exam at least every 2 years. Helmet use- Always wear a helmet when riding a bicycle, motorcycle, rollerblading or skateboarding. Safe sex- If you may be exposed to sexually transmitted infections, use a condom. Seat belts- Seat belts can save your live; always wear one. Smoke/Carbon Monoxide detectors- These detectors need to be installed on the appropriate level of your home. Replace  batteries at least once a year. Skin cancer- When out in the sun please cover up and use sunscreen 15 SPF or higher. Violence- If anyone is threatening or hurting you, please tell your healthcare provider.  Drink alcohol in moderation- Limit alcohol intake to one drink or less per day. Never drink and drive.

## 2020-11-15 NOTE — Progress Notes (Signed)
HPI: Eric Adams is a 50 y.o. male  who is here today for follow up on recent virtual visit. He was supposed to have his CPE.  I last saw him in 04/2018. He needs a CPE for insurance purposes. He lives with his wife. He is back to school and working full-time. Exercising regularly, he is walking at work 1 mile daily and 45 minutes of treadmill at home. He is trying to decrease sugar intake, he has not been consistent with following a healthful diet.  He eats homemade meals but loves rice.  Colonoscopy on 10/26/2019. Prostate cancer screening: Never. Nocturia x0. Hepatitis C screening: Completed on 03/17/13.  Immunization History  Administered Date(s) Administered   Hepatitis B, adult 03/22/2013   Hepatitis B, ped/adol 04/28/2013, 11/08/2013   Influenza,inj,Quad PF,6+ Mos 11/15/2020   Influenza-Unspecified 12/07/2014   He was seen in the ER on 11/04/2020, according to patient, he was diagnosed with influenza.  He was treated with Augmentin 875-125 mg twice daily x7 days. He had a virtual visit on 11/09/2020. Cough is gradually improving. Anterior chest wall achy pain with certain stretching movements.  Negative for exertional chest pain, palpitation, diaphoresis. + Dysphonia, also improving.  Lab Results  Component Value Date   CREATININE 1.17 11/04/2020   BUN 8 11/04/2020   NA 138 11/04/2020   K 3.6 11/04/2020   CL 103 11/04/2020   CO2 29 11/04/2020   Lab Results  Component Value Date   WBC 9.8 11/04/2020   HGB 14.8 11/04/2020   HCT 42.2 11/04/2020   MCV 79.6 (L) 11/04/2020   PLT 261 11/04/2020   Today he would like to have his flu vaccine.  Hyperlipidemia: Currently is not on statin medication. Last lipid panel done at his endocrinologist office, 03/28/2020: LDL 114, TC 215, triglycerides 313, and HDL 46. He is following low-fat diet.  DM2: Following with Dr. Buddy Duty at [place], last A1c 10.9 in 06/2020.  OSA: He wears his CPAP.  Following with sleep  specialist.  Review of Systems  Constitutional:  Positive for fatigue. Negative for appetite change and fever.  HENT:  Positive for voice change. Negative for mouth sores, nosebleeds, sinus pain, sore throat and trouble swallowing.   Eyes:  Negative for redness and visual disturbance.  Respiratory:  Positive for cough. Negative for shortness of breath, wheezing and stridor.   Cardiovascular:  Negative for palpitations and leg swelling.  Gastrointestinal:  Negative for abdominal pain, blood in stool, nausea and vomiting.  Endocrine: Negative for cold intolerance, heat intolerance, polydipsia, polyphagia and polyuria.  Genitourinary:  Negative for decreased urine volume, dysuria, genital sores, hematuria and testicular pain.  Musculoskeletal:  Negative for gait problem and joint swelling.  Skin:  Negative for color change and rash.  Allergic/Immunologic: Negative for environmental allergies.  Neurological:  Negative for syncope, weakness and headaches.  Hematological:  Negative for adenopathy. Does not bruise/bleed easily.  Psychiatric/Behavioral:  Negative for confusion. The patient is not nervous/anxious.   All other systems reviewed and are negative.  Current Outpatient Medications on File Prior to Visit  Medication Sig Dispense Refill   amoxicillin-clavulanate (AUGMENTIN) 875-125 MG tablet Take 1 tablet by mouth 2 (two) times daily. One po bid x 7 days 14 tablet 0   benzonatate (TESSALON) 200 MG capsule Take 1 capsule (200 mg total) by mouth 2 (two) times daily as needed for cough. 20 capsule 0   ONE TOUCH LANCETS MISC USE TO CHECK BLOOD SUGAR TWICE A DAY AND  PRN 100 each 6   ONETOUCH VERIO test strip USE TO CHECK BLOOD SUGAR TWICE A DAY AND AS NEEDED 818 each 0   TRULICITY 2.99 BZ/1.6RC SOPN Inject into the skin.     Current Facility-Administered Medications on File Prior to Visit  Medication Dose Route Frequency Provider Last Rate Last Admin   0.9 %  sodium chloride infusion  500 mL  Intravenous Once Pyrtle, Lajuan Lines, MD       Past Medical History:  Diagnosis Date   Diabetes mellitus without complication (HCC)    H. pylori infection    Hepatic steatosis    Hyperlipidemia    no med currently   Hypertension    no med currently   Sleep apnea    uses cpap nightly   Tubular adenoma of colon    No Known Allergies  Social History   Socioeconomic History   Marital status: Married    Spouse name: Not on file   Number of children: 1   Years of education: Not on file   Highest education level: Not on file  Occupational History   Occupation: Truck Education administrator: [employer]  Tobacco Use   Smoking status: Never   Smokeless tobacco: Never  Vaping Use   Vaping Use: Never used  Substance and Sexual Activity   Alcohol use: Yes    Comment: glass of wine occasionally   Drug use: No   Sexual activity: Yes    Birth control/protection: None  Other Topics Concern   Not on file  Social History Narrative   Not on file   Social Determinants of Health   Financial Resource Strain: Not on file  Food Insecurity: Not on file  Transportation Needs: Not on file  Physical Activity: Not on file  Stress: Not on file  Social Connections: Not on file   Vitals:   11/15/20 1004  BP: 134/80  Pulse: 84  Resp: 16  SpO2: 97%   Body mass index is 38.8 kg/m.  Physical Exam Vitals and nursing note reviewed.  Constitutional:      General: He is not in acute distress.    Appearance: He is well-developed.  HENT:     Head: Normocephalic and atraumatic.     Right Ear: Tympanic membrane, ear canal and external ear normal.     Left Ear: Tympanic membrane, ear canal and external ear normal.     Mouth/Throat:     Mouth: Mucous membranes are moist.     Pharynx: Oropharynx is clear.     Comments: Postnasal drainage. Eyes:     Extraocular Movements: Extraocular movements intact.     Conjunctiva/sclera: Conjunctivae normal.     Pupils: Pupils are equal, round, and  reactive to light.  Neck:     Thyroid: No thyromegaly.     Trachea: No tracheal deviation.  Cardiovascular:     Rate and Rhythm: Normal rate and regular rhythm.     Pulses:          Dorsalis pedis pulses are 2+ on the right side and 2+ on the left side.     Heart sounds: No murmur heard. Pulmonary:     Effort: Pulmonary effort is normal. No respiratory distress.     Breath sounds: Normal breath sounds.  Chest:     Chest wall: No tenderness or crepitus.  Abdominal:     Palpations: Abdomen is soft. There is no hepatomegaly or mass.     Tenderness: There is no abdominal  tenderness.  Genitourinary:    Comments: No concerns. Musculoskeletal:        General: No tenderness.     Cervical back: Normal range of motion.     Comments: No major deformities appreciated and no signs of synovitis.  Lymphadenopathy:     Cervical: No cervical adenopathy.     Upper Body:     Right upper body: No supraclavicular adenopathy.     Left upper body: No supraclavicular adenopathy.  Skin:    General: Skin is warm.     Findings: No erythema.  Neurological:     Mental Status: He is alert and oriented to person, place, and time.     Cranial Nerves: No cranial nerve deficit.     Sensory: No sensory deficit.     Coordination: Coordination normal.     Gait: Gait normal.     Deep Tendon Reflexes:     Reflex Scores:      Bicep reflexes are 2+ on the right side and 2+ on the left side.      Patellar reflexes are 2+ on the right side and 2+ on the left side. ASSESSMENT AND PLAN:  Mr. GILBERT MANOLIS was seen today for CPE and follow-up.  Orders Placed This Encounter  Procedures   Flu Vaccine QUAD 40mo+IM (Fluarix, Fluzone & Alfiuria Quad PF)   Comprehensive metabolic panel   Lipid panel   PSA   Lab Results  Component Value Date   PSA 0.52 11/15/2020   Lab Results  Component Value Date   CREATININE 1.06 11/15/2020   BUN 11 11/15/2020   NA 138 11/15/2020   K 4.1 11/15/2020   CL 100 11/15/2020    CO2 30 11/15/2020   Lab Results  Component Value Date   ALT 31 11/15/2020   AST 21 11/15/2020   ALKPHOS 110 11/15/2020   BILITOT 0.9 11/15/2020   Lab Results  Component Value Date   CHOL 168 11/15/2020   HDL 41.00 11/15/2020   LDLCALC 95 11/15/2020   LDLDIRECT 114.0 02/06/2015   TRIG 161.0 (H) 11/15/2020   CHOLHDL 4 11/15/2020   Routine general medical examination at a health care facility We discussed the importance of regular physical activity and healthy diet for prevention of chronic illness and/or complications. Preventive guidelines reviewed. Vaccination: He wants his flu vaccine today. Will give pneumovax and Tdap next visit.  Next CPE in a year.  Mixed hyperlipidemia He is not on pharmacologic treatment. We discussed CV benefits of statin medications. Further recommendations will be given according to FLP result.  Prostate cancer screening -     PSA  Cough Explained that cough and congestion can last a few days and even weeks after acute symptoms have results. Lung auscultation negative. Just completed abx treatment. He agrees with holding on CXR for now. Instructed about warning signs.  Chest wall pain Hx suggest musculoskeletal etiologies. Continue monitoring for now. Instructed about warning signs.  Need for influenza vaccination -     Flu Vaccine QUAD 66mo+IM (Fluarix, Fluzone & Alfiuria Quad PF)  Return in 5 months (on 04/17/2021).  Rayssa Atha G. Martinique, MD  [facility]. [place] office.

## 2020-11-20 LAB — HM DIABETES EYE EXAM

## 2020-11-21 ENCOUNTER — Other Ambulatory Visit: Payer: Self-pay

## 2020-11-21 MED ORDER — ATORVASTATIN CALCIUM 20 MG PO TABS: 20.0000 mg | ORAL_TABLET | Freq: Every day | ORAL | 6 refills | Status: DC

## 2021-03-28 ENCOUNTER — Ambulatory Visit (INDEPENDENT_AMBULATORY_CARE_PROVIDER_SITE_OTHER): Payer: BC Managed Care – PPO | Admitting: Adult Health

## 2021-03-28 ENCOUNTER — Encounter: Payer: Self-pay | Admitting: Adult Health

## 2021-03-28 VITALS — BP 140/102 | HR 86 | Temp 98.7°F | Ht 66.0 in | Wt 249.0 lb

## 2021-03-28 DIAGNOSIS — K625 Hemorrhage of anus and rectum: Secondary | ICD-10-CM

## 2021-03-28 MED ORDER — HYDROCORTISONE ACETATE 25 MG RE SUPP: 25.0000 mg | Freq: Two times a day (BID) | RECTAL | 0 refills | Status: DC

## 2021-03-28 NOTE — Progress Notes (Signed)
Subjective:    Patient ID: Eric Adams, male    DOB: 03/17/1970, 51 y.o.   MRN: [id number]  HPI  51 year old male who  has a past medical history of Diabetes mellitus without complication ([place]), H. pylori infection, Hepatic steatosis, Hyperlipidemia, Hypertension, Sleep apnea, and Tubular adenoma of colon.  History of Tubular ademoma of colon, last colonoscopy in 09/2019 which showed: - One 2 mm polyp in the cecum, removed with a cold biopsy forceps. Resected and retrieved. - One 1 mm polyp in the proximal rectum, removed with a cold biopsy forceps. Resected and retrieved. - Small internal hemorrhoids. - The examination was otherwise normal.  He presents to the office today for concern of blood in stool. He reports having a normal bowel movement yesterday and noticed right red blood in the stool. Today he had another bowel movement and lighter red blood in his stool and on the toilet paper.   Denies melena, abdominal pain, nausea or vomiting   BP Readings from Last 3 Encounters:  03/28/21 (!) 140/102  11/15/20 134/80  11/04/20 (!) 156/98   Review of Systems See HPI   Past Medical History:  Diagnosis Date   Diabetes mellitus without complication (HCC)    H. pylori infection    Hepatic steatosis    Hyperlipidemia    no med currently   Hypertension    no med currently   Sleep apnea    uses cpap nightly   Tubular adenoma of colon     Social History   Socioeconomic History   Marital status: Married    Spouse name: Not on file   Number of children: 1   Years of education: Not on file   Highest education level: Not on file  Occupational History   Occupation: Truck Education administrator: [employer]  Tobacco Use   Smoking status: Never   Smokeless tobacco: Never  Vaping Use   Vaping Use: Never used  Substance and Sexual Activity   Alcohol use: Yes    Comment: glass of wine occasionally   Drug use: No   Sexual activity: Yes    Birth control/protection: None   Other Topics Concern   Not on file  Social History Narrative   Not on file   Social Determinants of Health   Financial Resource Strain: Not on file  Food Insecurity: Not on file  Transportation Needs: Not on file  Physical Activity: Not on file  Stress: Not on file  Social Connections: Not on file  Intimate Partner Violence: Not on file    Past Surgical History:  Procedure Laterality Date   BREATH Wyman Songster PYLORI N/A 12/21/2012   Procedure: BREATH Eliezer Champagne;  Surgeon: Jerene Bears, MD;  Location: [facility];  Service: Gastroenterology;  Laterality: N/A;   COLONOSCOPY  07/2015   PYRTLE - TA X 3   FOOT SURGERY Right     Family History  Problem Relation Age of Onset   Colon cancer Neg Hx    Diabetes Neg Hx    Heart disease Neg Hx    Hypertension Neg Hx    Kidney disease Neg Hx    Rectal cancer Neg Hx    Stomach cancer Neg Hx     No Known Allergies  Current Outpatient Medications on File Prior to Visit  Medication Sig Dispense Refill   atorvastatin (LIPITOR) 20 MG tablet Take 1 tablet (20 mg total) by mouth daily. 30 tablet 6   benzonatate (  TESSALON) 200 MG capsule Take 1 capsule (200 mg total) by mouth 2 (two) times daily as needed for cough. 20 capsule 0   ONE TOUCH LANCETS MISC USE TO CHECK BLOOD SUGAR TWICE A DAY AND PRN 100 each 6   ONETOUCH VERIO test strip USE TO CHECK BLOOD SUGAR TWICE A DAY AND AS NEEDED 440 each 0   TRULICITY 1.02 VO/5.3GU SOPN Inject into the skin.     Current Facility-Administered Medications on File Prior to Visit  Medication Dose Route Frequency Provider Last Rate Last Admin   0.9 %  sodium chloride infusion  500 mL Intravenous Once Pyrtle, Lajuan Lines, MD        BP (!) 140/102    Pulse 86    Temp 98.7 F (37.1 C) (Oral)    Ht 5\' 6"  (1.676 m)    Wt 249 lb (112.9 kg)    SpO2 95%    BMI 40.19 kg/m       Objective:   Physical Exam Vitals and nursing note reviewed.  Constitutional:      Appearance: Normal appearance. He is obese.   Abdominal:     General: Abdomen is flat. Bowel sounds are normal.     Palpations: Abdomen is soft.  Genitourinary:    Prostate: Normal.     Rectum: Guaiac result positive. Internal hemorrhoid (5 oclock position) present. No tenderness, anal fissure or external hemorrhoid. Normal anal tone.  Skin:    General: Skin is warm and dry.  Neurological:     General: No focal deficit present.     Mental Status: He is alert and oriented to person, place, and time.  Psychiatric:        Mood and Affect: Mood normal.        Behavior: Behavior normal.        Thought Content: Thought content normal.        Judgment: Judgment normal.      Assessment & Plan:  1. Bright red rectal bleeding - Likely from internal hemorrhoid.  + Hemoccult.  - Advised fiber supplement. No sitting for long periods of time on the toilet, or straining  - He does have an upcoming GI appointment   - hydrocortisone (ANUSOL-HC) 25 MG suppository; Place 1 suppository (25 mg total) rectally 2 (two) times daily.  Dispense: 12 suppository; Refill: 0  Dorothyann Peng, NP  Time spent on chart review, time with patient; discussion of hemorrhoids and rectal bleeding home monitoring,  treatment, follow up plan, and documentation 30 minutes

## 2021-04-12 ENCOUNTER — Ambulatory Visit: Payer: BC Managed Care – PPO | Admitting: Physician Assistant

## 2021-04-17 ENCOUNTER — Ambulatory Visit: Payer: BC Managed Care – PPO | Admitting: Family Medicine

## 2021-04-20 ENCOUNTER — Encounter: Payer: Self-pay | Admitting: Family Medicine

## 2021-04-20 ENCOUNTER — Encounter: Payer: BC Managed Care – PPO | Admitting: Family Medicine

## 2021-04-20 VITALS — BP 130/80 | HR 91 | Ht 66.0 in | Wt 248.5 lb

## 2021-04-20 DIAGNOSIS — E782 Mixed hyperlipidemia: Secondary | ICD-10-CM

## 2021-04-20 NOTE — Progress Notes (Signed)
Left without being seen.

## 2021-04-24 DIAGNOSIS — E119 Type 2 diabetes mellitus without complications: Secondary | ICD-10-CM | POA: Diagnosis not present

## 2021-05-02 NOTE — Progress Notes (Signed)
? ?Eric Adams is a 51 y.o.male, who is here today for follow up. ? ?He was last seen on 11/15/20 for his CPE. ?DM II: He has not seen his endocrinologist for about 8 months. ?He had a HgA1C through work and it was 10.0 in 03/2021. ?He is on Trulicity 7.35 mg weekly. ?BS's "little high." ?He has an appt with Dr Buddy Duty next months. ? ?Hypertension:  ?Medications:He has not been on HCTZ,Amlodipine,or Losartan for a few months. ?BP readings at home:Not checking. BP has been elevated during recent visits: 138/100,140/102. ?Side effects:None. ?Negative for unusual or severe headache, visual changes, exertional chest pain, dyspnea,  focal weakness, or edema. ? ?OSA: He is wearing CPAP while he is in [place]. He has been overseas a couple times and cannot bring his machine with him. ? ?Lab Results  ?Component Value Date  ? CREATININE 1.06 11/15/2020  ? BUN 11 11/15/2020  ? NA 138 11/15/2020  ? K 4.1 11/15/2020  ? CL 100 11/15/2020  ? CO2 30 11/15/2020  ? ?HLD: Last visit Atorvastatin 20 mg daily was recommended, he has not started medication. ? ?Lab Results  ?Component Value Date  ? CHOL 168 11/15/2020  ? HDL 41.00 11/15/2020  ? [place] 95 11/15/2020  ? LDLDIRECT 114.0 02/06/2015  ? TRIG 161.0 (H) 11/15/2020  ? CHOLHDL 4 11/15/2020  ? ?Review of Systems  ?Constitutional:  Negative for activity change, appetite change and fever.  ?HENT:  Negative for nosebleeds and sore throat.   ?Respiratory:  Negative for cough and wheezing.   ?Gastrointestinal:  Negative for abdominal pain, nausea and vomiting.  ?Genitourinary:  Negative for decreased urine volume and hematuria.  ?Neurological:  Negative for syncope, facial asymmetry and weakness.  ?Rest see pertinent positives and negatives per HPI. ? ?Current Outpatient Medications on File Prior to Visit  ?Medication Sig Dispense Refill  ? hydrocortisone (ANUSOL-HC) 25 MG suppository Place 1 suppository (25 mg total) rectally 2 (two) times daily. 12 suppository 0  ? ONE TOUCH LANCETS  MISC USE TO CHECK BLOOD SUGAR TWICE A DAY AND PRN 100 each 6  ? ONETOUCH VERIO test strip USE TO CHECK BLOOD SUGAR TWICE A DAY AND AS NEEDED 329 each 0  ? TRULICITY 9.24 QA/8.3MH SOPN Inject into the skin.    ? ?Current Facility-Administered Medications on File Prior to Visit  ?Medication Dose Route Frequency Provider Last Rate Last Admin  ? 0.9 %  sodium chloride infusion  500 mL Intravenous Once Pyrtle, Lajuan Lines, MD      ? ?Past Medical History:  ?Diagnosis Date  ? Diabetes mellitus without complication ([place])   ? H. pylori infection   ? Hepatic steatosis   ? Hyperlipidemia   ? no med currently  ? Hypertension   ? no med currently  ? Sleep apnea   ? uses cpap nightly  ? Tubular adenoma of colon   ? ?No Known Allergies ? ?Social History  ? ?Socioeconomic History  ? Marital status: Married  ?  Spouse name: Not on file  ? Number of children: 1  ? Years of education: Not on file  ? Highest education level: Not on file  ?Occupational History  ? Occupation: Administrator  ?  Employer: [employer]  ?Tobacco Use  ? Smoking status: Never  ? Smokeless tobacco: Never  ?Vaping Use  ? Vaping Use: Never used  ?Substance and Sexual Activity  ? Alcohol use: Yes  ?  Comment: glass of wine occasionally  ? Drug use:  No  ? Sexual activity: Yes  ?  Birth control/protection: None  ?Other Topics Concern  ? Not on file  ?Social History Narrative  ? Not on file  ? ?Social Determinants of Health  ? ?Financial Resource Strain: Not on file  ?Food Insecurity: Not on file  ?Transportation Needs: Not on file  ?Physical Activity: Not on file  ?Stress: Not on file  ?Social Connections: Not on file  ? ?Vitals:  ? 05/04/21 1436  ?BP: (!) 142/98  ?Pulse: 90  ?Resp: 16  ?SpO2: 97%  ? ?Body mass index is 40.27 kg/m?. ? ?Physical Exam ?Nursing note reviewed.  ?Constitutional:   ?   General: He is not in acute distress. ?   Appearance: He is well-developed.  ?HENT:  ?   Head: Normocephalic and atraumatic.  ?Eyes:  ?   Conjunctiva/sclera: Conjunctivae  normal.  ?Cardiovascular:  ?   Rate and Rhythm: Normal rate and regular rhythm.  ?   Pulses:     ?     Dorsalis pedis pulses are 2+ on the right side and 2+ on the left side.  ?   Heart sounds: No murmur heard. ?Pulmonary:  ?   Effort: Pulmonary effort is normal. No respiratory distress.  ?   Breath sounds: Normal breath sounds.  ?Abdominal:  ?   Palpations: Abdomen is soft. There is no hepatomegaly or mass.  ?   Tenderness: There is no abdominal tenderness.  ?Lymphadenopathy:  ?   Cervical: No cervical adenopathy.  ?Skin: ?   General: Skin is warm.  ?   Findings: No erythema or rash.  ?Neurological:  ?   Mental Status: He is alert and oriented to person, place, and time.  ?   Cranial Nerves: No cranial nerve deficit.  ?   Gait: Gait normal.  ?Psychiatric:  ?   Comments: Well groomed, good eye contact.  ? ?ASSESSMENT AND PLAN:  ? ?[name] was seen today for follow-up. ? ?Diagnoses and all orders for this visit: ? ?Orders Placed This Encounter  ?Procedures  ? Comprehensive metabolic panel  ? Lipid panel  ? ?Type 2 diabetes mellitus with other specified complication ([place]) ?Problem is not well controlled. ?Possible complications of poorly controlled glucose discussed. ?Last eye exam 3 months ago. ?Recommend increasing dose of Trulicity from 9.41 mg to 1.5 mg, samples given. ?Keep appt with endocrinologist next month. ? ?Mixed hyperlipidemia ?We discussed some CV benefits of stain, Atorvastatin 20 mg daily recommended as well as low fat diet. ?ALT and FLP in 6-8 weeks. ? ?Benign essential hypertension ?BP is not well controlled. ?Possible complications of elevated BP discussed. ?Start Amlodipine-Olmesartan 5-10 mg daily. ?Low salt diet. ?Monitor BP at home, instructed to let me know about BP readings in 3 weeks. ?Instructed about warning signs. ?Follow-up in 3-4 months. ? ?Return in about 4 months (around 09/03/2021) for Labs in 6 weeks, fasting.. ? ?Eric Melucci G. Martinique, MD ? ?[place]. ?[place] office. ? ?

## 2021-05-04 ENCOUNTER — Encounter: Payer: Self-pay | Admitting: Family Medicine

## 2021-05-04 ENCOUNTER — Ambulatory Visit (INDEPENDENT_AMBULATORY_CARE_PROVIDER_SITE_OTHER): Payer: Commercial Managed Care - PPO | Admitting: Family Medicine

## 2021-05-04 VITALS — BP 142/98 | HR 90 | Resp 16 | Ht 66.0 in | Wt 249.5 lb

## 2021-05-04 DIAGNOSIS — I10 Essential (primary) hypertension: Secondary | ICD-10-CM

## 2021-05-04 DIAGNOSIS — E1169 Type 2 diabetes mellitus with other specified complication: Secondary | ICD-10-CM

## 2021-05-04 DIAGNOSIS — E782 Mixed hyperlipidemia: Secondary | ICD-10-CM

## 2021-05-04 MED ORDER — AMLODIPINE BESY-BENAZEPRIL HCL 5-10 MG PO CAPS: 1.0000 | ORAL_CAPSULE | Freq: Every day | ORAL | 0 refills | Status: DC

## 2021-05-04 MED ORDER — ATORVASTATIN CALCIUM 20 MG PO TABS: 20.0000 mg | ORAL_TABLET | Freq: Every day | ORAL | 2 refills | Status: DC

## 2021-05-04 NOTE — Assessment & Plan Note (Signed)
Problem is not well controlled. ?Possible complications of poorly controlled glucose discussed. ?Last eye exam 3 months ago. ?Recommend increasing dose of Trulicity from 5.67 mg to 1.5 mg, samples given. ?Keep appt with endocrinologist next month. ?

## 2021-05-04 NOTE — Assessment & Plan Note (Addendum)
BP is not well controlled. ?Possible complications of elevated BP discussed. ?Start Amlodipine-Olmesartan 5-10 mg daily. ?Low salt diet. ?Monitor BP at home, instructed to let me know about BP readings in 3 weeks. ?Instructed about warning signs. ?Follow-up in 3-4 months. ? ?

## 2021-05-04 NOTE — Assessment & Plan Note (Addendum)
We discussed some CV benefits of stain, Atorvastatin 20 mg daily recommended as well as low fat diet. ?ALT and FLP in 6-8 weeks. ?

## 2021-05-04 NOTE — Patient Instructions (Addendum)
A few things to remember from today's visit: ? ? ?Mixed hyperlipidemia - Plan: atorvastatin (LIPITOR) 20 MG tablet ? ?Benign essential hypertension - Plan: amLODipine-benazepril (LOTREL) 5-10 MG capsule ? ?Type 2 diabetes mellitus with other specified complication, without long-term current use of insulin ([place]) ? ?If you need refills please call your pharmacy. ?Do not use My Chart to request refills or for acute issues that need immediate attention. ?  ?Please be sure medication list is accurate. ?If a new problem present, please set up appointment sooner than planned today. ? ?Today samples of Truicity 1.5 given to continue weekly. ?Keep appt with your endocrinologist. ?Today we are starting medications for blood pressure, 2 meds in one tab to take daily. ?Monitor blood pressure at home and let me know about readings in 3 weeks. ?Labs in 6 weeks. ? ?Atorvastatin 20 mg to take once daily. ? ? ? ? ? ?

## 2021-08-05 ENCOUNTER — Other Ambulatory Visit: Payer: Self-pay | Admitting: Family Medicine

## 2021-08-05 DIAGNOSIS — I10 Essential (primary) hypertension: Secondary | ICD-10-CM

## 2021-08-31 NOTE — Progress Notes (Deleted)
HPI: Mr.Eric Adams is a 51 y.o. male, who is here today for chronic disease management.  Last seen on 05/04/21.   Hyperlipidemia: Currently on *** Following a low fat diet: ***. Side effects from medication:*** Lab Results  Component Value Date   CHOL 168 11/15/2020   HDL 41.00 11/15/2020   LDLCALC 95 11/15/2020   LDLDIRECT 114.0 02/06/2015   TRIG 161.0 (H) 11/15/2020   CHOLHDL 4 11/15/2020    Hypertension:  Medications:*** BP readings at home:*** Side effects:***  Negative for unusual or severe headache, visual changes, exertional chest pain, dyspnea,  focal weakness, or edema.  Lab Results  Component Value Date   CREATININE 1.06 11/15/2020   BUN 11 11/15/2020   NA 138 11/15/2020   K 4.1 11/15/2020   CL 100 11/15/2020   CO2 30 11/15/2020    Diabetes Mellitus II:  - Checking BG at home: *** - Medications: *** - Compliance: *** - Diet: *** - Exercise: *** - eye exam: *** - foot exam: *** - Negative for symptoms of hypoglycemia, polyuria, polydipsia, numbness extremities, foot ulcers/trauma  Lab Results  Component Value Date   HGBA1C 6.4 09/06/2015   Lab Results  Component Value Date   MICROALBUR 0.9 05/03/2015    Review of Systems Rest of ROS see pertinent positives and negatives in HPI.  Current Outpatient Medications on File Prior to Visit  Medication Sig Dispense Refill   amLODipine-benazepril (LOTREL) 5-10 MG capsule TAKE 1 CAPSULE BY MOUTH EVERY DAY 90 capsule 0   atorvastatin (LIPITOR) 20 MG tablet Take 1 tablet (20 mg total) by mouth daily. 90 tablet 2   hydrocortisone (ANUSOL-HC) 25 MG suppository Place 1 suppository (25 mg total) rectally 2 (two) times daily. 12 suppository 0   ONE TOUCH LANCETS MISC USE TO CHECK BLOOD SUGAR TWICE A DAY AND PRN 100 each 6   ONETOUCH VERIO test strip USE TO CHECK BLOOD SUGAR TWICE A DAY AND AS NEEDED 532 each 0   TRULICITY 9.92 EQ/6.8TM SOPN Inject into the skin.     Current Facility-Administered  Medications on File Prior to Visit  Medication Dose Route Frequency Provider Last Rate Last Admin   0.9 %  sodium chloride infusion  500 mL Intravenous Once Pyrtle, Lajuan Lines, MD        Past Medical History:  Diagnosis Date   Diabetes mellitus without complication (HCC)    H. pylori infection    Hepatic steatosis    Hyperlipidemia    no med currently   Hypertension    no med currently   Sleep apnea    uses cpap nightly   Tubular adenoma of colon    No Known Allergies  Social History   Socioeconomic History   Marital status: Married    Spouse name: Not on file   Number of children: 1   Years of education: Not on file   Highest education level: Not on file  Occupational History   Occupation: Truck Education administrator: [employer]  Tobacco Use   Smoking status: Never   Smokeless tobacco: Never  Vaping Use   Vaping Use: Never used  Substance and Sexual Activity   Alcohol use: Yes    Comment: glass of wine occasionally   Drug use: No   Sexual activity: Yes    Birth control/protection: None  Other Topics Concern   Not on file  Social History Narrative   Not on file   Social Determinants of Health  Financial Resource Strain: Not on file  Food Insecurity: Not on file  Transportation Needs: Not on file  Physical Activity: Not on file  Stress: Not on file  Social Connections: Not on file    There were no vitals filed for this visit. There is no height or weight on file to calculate BMI.  Physical Exam  ASSESSMENT AND PLAN:  Diagnoses and all orders for this visit:  Benign essential hypertension  Type 2 diabetes mellitus with other specified complication, without long-term current use of insulin ([place])  Mixed hyperlipidemia    No orders of the defined types were placed in this encounter.   No problem-specific Assessment & Plan notes found for this encounter.   No follow-ups on file.  Betty G. Martinique, MD  [facility]. [place]  office.

## 2021-09-03 ENCOUNTER — Ambulatory Visit: Payer: BC Managed Care – PPO | Admitting: Family Medicine

## 2021-09-03 DIAGNOSIS — E1169 Type 2 diabetes mellitus with other specified complication: Secondary | ICD-10-CM

## 2021-09-03 DIAGNOSIS — E782 Mixed hyperlipidemia: Secondary | ICD-10-CM

## 2021-09-03 DIAGNOSIS — I10 Essential (primary) hypertension: Secondary | ICD-10-CM

## 2021-10-30 ENCOUNTER — Telehealth: Payer: Self-pay

## 2021-10-30 NOTE — Telephone Encounter (Signed)
Caller states husband's BP is high and his employer said too high to drive. Pt is NOT available and caller wants to make appt only  10/30/2021 11:32:58 AM Clinical Call Yes Carmon, RN, Denise  Pt has appt with Dr Elease Hashimoto on 10/31/21 at 1045

## 2021-10-31 ENCOUNTER — Ambulatory Visit (INDEPENDENT_AMBULATORY_CARE_PROVIDER_SITE_OTHER): Payer: Commercial Managed Care - PPO | Admitting: Family Medicine

## 2021-10-31 VITALS — BP 126/86 | HR 81 | Temp 98.3°F | Wt 243.1 lb

## 2021-10-31 DIAGNOSIS — E1169 Type 2 diabetes mellitus with other specified complication: Secondary | ICD-10-CM

## 2021-10-31 DIAGNOSIS — I10 Essential (primary) hypertension: Secondary | ICD-10-CM

## 2021-10-31 LAB — POCT GLYCOSYLATED HEMOGLOBIN (HGB A1C): Hemoglobin A1C: 8.1 % — AB (ref 4.0–5.6)

## 2021-10-31 MED ORDER — AMLODIPINE BESY-BENAZEPRIL HCL 5-10 MG PO CAPS: ORAL_CAPSULE | ORAL | 3 refills | Status: DC

## 2021-10-31 NOTE — Patient Instructions (Signed)
A1C today is 8.1%.  Blood pressure looks good.

## 2021-10-31 NOTE — Progress Notes (Signed)
Established Patient Office Visit  Subjective   Patient ID: Eric Adams, male    DOB: 02-16-1971  Age: 51 y.o. MRN: [id number]  Chief Complaint  Patient presents with   Hypertension   Medication Refill    For BP med and atorvastatin.     HPI   Has history of hypertension and type 2 diabetes.  He is basically here today requesting refill of his Lotrel.  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  Denies any recent headaches or dizziness.  He has type 2 diabetes followed by endocrinology.  Apparently has been on Trulicity but also ran out of that couple weeks ago.  He is requesting A1c today.  Past Medical History:  Diagnosis Date   Diabetes mellitus without complication (HCC)    H. pylori infection    Hepatic steatosis    Hyperlipidemia    no med currently   Hypertension    no med currently   Sleep apnea    uses cpap nightly   Tubular adenoma of colon    Past Surgical History:  Procedure Laterality Date   BREATH TEK H PYLORI N/A 12/21/2012   Procedure: BREATH TEK Kandis Ban;  Surgeon: Jerene Bears, MD;  Location: [facility];  Service: Gastroenterology;  Laterality: N/A;   COLONOSCOPY  07/2015   PYRTLE - TA X 3   FOOT SURGERY Right     reports that he has never smoked. He has never used smokeless tobacco. He reports current alcohol use. He reports that he does not use drugs. family history is not on file. No Known Allergies  Review of Systems  Constitutional:  Negative for malaise/fatigue.  Eyes:  Negative for blurred vision.  Respiratory:  Negative for shortness of breath.   Cardiovascular:  Negative for chest pain.  Neurological:  Negative for dizziness, weakness and headaches.      Objective:     BP 126/86 (BP Location: Left Arm, Patient Position: Sitting, Cuff Size: Large)   Pulse 81   Temp 98.3 F (36.8 C) (Oral)   Wt 243 lb 1.6 oz (110.3 kg)   SpO2 96%   BMI 39.24 kg/m    Physical  Exam Vitals reviewed.  Constitutional:      Appearance: He is well-developed.  HENT:     Right Ear: External ear normal.     Left Ear: External ear normal.  Eyes:     Pupils: Pupils are equal, round, and reactive to light.  Neck:     Thyroid: No thyromegaly.  Cardiovascular:     Rate and Rhythm: Normal rate and regular rhythm.  Pulmonary:     Effort: Pulmonary effort is normal. No respiratory distress.     Breath sounds: Normal breath sounds. No wheezing or rales.  Musculoskeletal:     Cervical back: Neck supple.     Right lower leg: No edema.     Left lower leg: No edema.  Neurological:     Mental Status: He is alert and oriented to person, place, and time.      Results for orders placed or performed in visit on 10/31/21  POC HgB A1c  Result Value Ref Range   Hemoglobin A1C 8.1 (A) 4.0 - 5.6 %   HbA1c POC (<> result, manual entry)     HbA1c, POC (prediabetic range)     HbA1c, POC (controlled diabetic range)        The 10-year ASCVD  risk score (Arnett DK, et al., 2019) is: 8%    Assessment & Plan:   Problem List Items Addressed This Visit       Unprioritized   Type 2 diabetes mellitus with other specified complication ([place]) - Primary   Relevant Medications   amLODipine-benazepril (LOTREL) 5-10 MG capsule   Other Relevant Orders   POC HgB A1c (Completed)   Benign essential hypertension   Relevant Medications   amLODipine-benazepril (LOTREL) 5-10 MG capsule  -Surprisingly, blood pressure fairly well controlled today but has had multiple elevated readings recently off Lotrel. -Refill Lotrel for 1 year.  Schedule follow-up with primary soon. -Keep sodium intake down. -Continue regular aerobic exercise -Patient requesting A1c today which came back 8.1%.  This apparently is down from 10 range last check.  Keep close follow-up with endocrinology as scheduled  No follow-ups on file.    Carolann Littler, MD

## 2021-12-14 NOTE — Progress Notes (Deleted)
    12/14/2021 Eric Adams [id number] 1970/12/18  Referring provider: Martinique, Betty G, MD Primary GI doctor: Dr. Hilarie Fredrickson  ASSESSMENT AND PLAN:   There are no diagnoses linked to this encounter.   History of Present Illness:  51 y.o. male  with a past medical history of type 2 diabetes, hyperlipidemia, morbid obesity, OSA, fatty liver, previous H. pylori infection treated and eradicated and others listed below, returns to clinic today for evaluation of diarrhea. 07/22/2017 office visit with Dr. Hilarie Fredrickson for loose stools and diarrhea, symptoms most consistent with IBS, previous colonoscopy negative for microscopic colitis Has tried colestipol, given Xifaxan for IBS with diarrhea 10/26/2019 colonoscopy for personal history of adenomatous polyps good bowel prep 2 mm polyp cecum, 1 mm polyp proximal rectum small internal hemorrhoids  ***  He  reports that he has never smoked. He has never used smokeless tobacco. He reports current alcohol use. He reports that he does not use drugs. His family history is not on file.   Current Medications:   Current Outpatient Medications (Endocrine & Metabolic):    TRULICITY 7.62 GB/1.5VV SOPN, Inject into the skin. (Patient not taking: Reported on 10/31/2021)   Current Outpatient Medications (Cardiovascular):    amLODipine-benazepril (LOTREL) 5-10 MG capsule, TAKE 1 CAPSULE BY MOUTH EVERY DAY   atorvastatin (LIPITOR) 20 MG tablet, Take 1 tablet (20 mg total) by mouth daily.         Current Outpatient Medications (Other):    hydrocortisone (ANUSOL-HC) 25 MG suppository, Place 1 suppository (25 mg total) rectally 2 (two) times daily. (Patient not taking: Reported on 10/31/2021)   ONE TOUCH LANCETS MISC, USE TO CHECK BLOOD SUGAR TWICE A DAY AND PRN   ONETOUCH VERIO test strip, USE TO CHECK BLOOD SUGAR TWICE A DAY AND AS NEEDED  Current Facility-Administered Medications (Other):    0.9 %  sodium chloride infusion  Surgical History:  He  has a  past surgical history that includes Foot surgery (Right); Breath tek h pylori (N/A, 12/21/2012); and Colonoscopy (07/2015).  Current Medications, Allergies, Past Medical History, Past Surgical History, Family History and Social History were reviewed in [employer] record.  Physical Exam: There were no vitals taken for this visit. General:   Pleasant, well developed male in no acute distress Heart : Regular rate and rhythm; no murmurs Pulm: Clear anteriorly; no wheezing Abdomen:  {BlankSingle:19197::"Distended","Ridged","Soft"}, {BlankSingle:19197::"Flat","Obese","Non-distended"} AB, {BlankSingle:19197::"Absent","Hyperactive, tinkling","Hypoactive","Sluggish","Active"} bowel sounds. {actendernessAB:27319} tenderness {anatomy; site abdomen:5010}. {BlankMultiple:19196::"Without guarding","With guarding","Without rebound","With rebound"}, No organomegaly appreciated. Rectal: {acrectalexam:27461} Extremities:  {With/without:5700}  edema. Neurologic:  Alert and  oriented x4;  No focal deficits.  Psych:  Cooperative. Normal mood and affect.   Vladimir Crofts, PA-C 12/14/21

## 2021-12-17 ENCOUNTER — Ambulatory Visit: Payer: Self-pay | Admitting: Physician Assistant

## 2022-01-21 ENCOUNTER — Ambulatory Visit: Payer: Self-pay | Admitting: Physician Assistant

## 2022-01-22 NOTE — Progress Notes (Signed)
01/23/2022 Eric Adams [id number] May 28, 1970  Referring provider: [place], Betty G, MD Primary GI doctor: Dr. 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 Wife is concerned about history of fatty liver, last ultrasound 2014, last LFTs 2022 unremarkable. We will check labs to evaluate, repeat abdominal ultrasound complete with abdominal discomfort. Had previous negative hepatitis A, B, C, ferritin, ANA. We will get IgG, AMA and ASMA. - Consider elastography --Continue to work on risk factor modification including diet exercise and control of risk factors including blood sugars.  Generalized abdominal pain with loose stools mainly on the weekends or 2-3 days a week, worse on an empty stomach without any associated symptoms such as nausea, vomiting.   Patient slightly difficult historian.   Appears this is when he also came to see Dr. Rhea Belton for in 2014/2015. Patient states its not bad enough for him to take medication, will do IBgard. Colonoscopy unremarkable 2021 recall 5 years. We will get abdominal ultrasound, pending labs may switch to this to CT abdomen pelvis however no nausea, no vomiting, no abdominal pain on palpation. Patient is not on Trulicity, just on metformin and getting freestyle libre for his diabetes, question of potentially IBS-D, autonomic associated diabetes Add fiber, given FODMAP  Morbid obesity (HCC) Body mass index is 41.54 kg/m.  -Patient has been advised to make an attempt to improve diet and exercise patterns to aid in weight loss. -Recommended diet heavy in fruits and veggies and low in animal meats, cheeses, and dairy products, appropriate calorie intake  Type 2 diabetes mellitus with other specified complication, without long-term current use of insulin (HCC) Control sugars  Patient Care Team: [place], Betty G, MD as PCP - General (Family  Medicine)  HISTORY OF PRESENT ILLNESS: 51 y.o. male with a past medical history of hypertension, OSA, type 2 diabetes with hyperlipidemia, MASH, obesity, history of H. pylori infection and others listed below presents for evaluation of severe diarrhea.   On Trulicity 10/26/2019 colonoscopy with Dr. Rhea Belton for high risk colon surveillance history of adenomatous polyps, good prep, 2 mm polyp cecum, 1 mm polyp proximal rectum, internal hemorrhoids recall 5 years 09/2024  Wife is here with him and provides some of the story. Patient is from [place].  Has had years of loose stools.  He states worse if he is fasting or does not eat small frequent stools.  He states he has increased salad and veggies, causes AB pain and loose stools.  Carbs make him feel better.  Can have BM 3-4 times in 3 hours.  Mainly happens on the weekends.  Denies nausea, vomiting, weight loss/gain, no GERD, no dysphagia.  He states when his stomach is empty for a long period of time he is having AB pain.   Denies pruritus, swelling in legs or AB. No fatigue.  01/2013 AB US showed diffuse fat on liver 2015 had negative ANA, normal Hep B, C and A, unremarkable ferritin.  No ETOH, no smoking.   CBC  11/04/2020  HGB 14.8 MCV 79.6 without evidence of anemia WBC 9.8 Platelets 261 Kidney function 11/15/2020  BUN 11 Cr 1.06  GFR >60  Potassium 4.1   LFTs 11/15/2020  AST 21 ALT 31 Alkphos 110 TBili 0.9  He  reports that he has never smoked. He has never used smokeless tobacco. He reports current alcohol use. He reports that he does not use drugs.  Current Medications:  Current Outpatient Medications (Endocrine & Metabolic):    TRULICITY 0.75 MG/0.5ML SOPN, Inject into the skin. (Patient not taking: Reported on 10/31/2021)   Current Outpatient Medications (Cardiovascular):    atorvastatin (LIPITOR) 20 MG tablet, Take 1 tablet (20 mg total) by mouth daily.   amLODipine-benazepril (LOTREL) 5-10 MG capsule, TAKE 1 CAPSULE BY  MOUTH EVERY DAY (Patient not taking: Reported on 01/23/2022)         Current Outpatient Medications (Other):    hydrocortisone (ANUSOL-HC) 25 MG suppository, Place 1 suppository (25 mg total) rectally 2 (two) times daily. (Patient not taking: Reported on 10/31/2021)   ONE TOUCH LANCETS MISC, USE TO CHECK BLOOD SUGAR TWICE A DAY AND PRN (Patient not taking: Reported on 01/23/2022)   ONETOUCH VERIO test strip, USE TO CHECK BLOOD SUGAR TWICE A DAY AND AS NEEDED (Patient not taking: Reported on 01/23/2022)  Current Facility-Administered Medications (Other):    0.9 %  sodium chloride infusion  Medical History:  Past Medical History:  Diagnosis Date   Diabetes mellitus without complication (HCC)    H. pylori infection    Hepatic steatosis    Hyperlipidemia    no med currently   Hypertension    no med currently   Sensorineural hearing loss, bilateral    Sleep apnea    uses cpap nightly   Tubular adenoma of colon    Allergies: No Known Allergies   Surgical History:  He  has a past surgical history that includes Foot surgery (Right); Breath tek h pylori (N/A, 12/21/2012); and Colonoscopy (07/2015). Family History:  His family history is not on file.  REVIEW OF SYSTEMS  : All other systems reviewed and negative except where noted in the History of Present Illness.  PHYSICAL EXAM: BP (!) 150/98   Pulse 86   Ht 5\' 6"  (1.676 m)   Wt 257 lb 6 oz (116.7 kg)   BMI 41.54 kg/m  General:   Pleasant, well developed male in no acute distress Head:   Normocephalic and atraumatic. Eyes:  sclerae anicteric,conjunctive pink  Heart:   regular rate and rhythm Pulm:  Clear anteriorly; no wheezing Abdomen:   Soft, Obese AB, Active bowel sounds. No tenderness . Without guarding and Without rebound, No organomegaly appreciated. Rectal: Not evaluated Extremities:  Without edema. Msk: Symmetrical without gross deformities. Peripheral pulses intact.  Neurologic:  Alert and  oriented x4;  No  focal deficits.  Skin:   Dry and intact without significant lesions or rashes. Psychiatric:  Cooperative. Normal mood and affect.    Doree Albee, PA-C 4:13 PM

## 2022-01-23 ENCOUNTER — Other Ambulatory Visit (INDEPENDENT_AMBULATORY_CARE_PROVIDER_SITE_OTHER): Payer: Commercial Managed Care - PPO

## 2022-01-23 ENCOUNTER — Ambulatory Visit: Payer: Commercial Managed Care - PPO | Admitting: Physician Assistant

## 2022-01-23 ENCOUNTER — Encounter: Payer: Self-pay | Admitting: Physician Assistant

## 2022-01-23 VITALS — BP 150/98 | HR 86 | Ht 66.0 in | Wt 257.4 lb

## 2022-01-23 DIAGNOSIS — A048 Other specified bacterial intestinal infections: Secondary | ICD-10-CM

## 2022-01-23 DIAGNOSIS — R195 Other fecal abnormalities: Secondary | ICD-10-CM

## 2022-01-23 DIAGNOSIS — K760 Fatty (change of) liver, not elsewhere classified: Secondary | ICD-10-CM | POA: Diagnosis not present

## 2022-01-23 DIAGNOSIS — R1084 Generalized abdominal pain: Secondary | ICD-10-CM

## 2022-01-23 DIAGNOSIS — E1169 Type 2 diabetes mellitus with other specified complication: Secondary | ICD-10-CM

## 2022-01-23 LAB — COMPREHENSIVE METABOLIC PANEL
ALT: 26 U/L (ref 0–53)
AST: 19 U/L (ref 0–37)
Albumin: 4.4 g/dL (ref 3.5–5.2)
Alkaline Phosphatase: 109 U/L (ref 39–117)
BUN: 15 mg/dL (ref 6–23)
CO2: 28 mEq/L (ref 19–32)
Calcium: 9.2 mg/dL (ref 8.4–10.5)
Chloride: 101 mEq/L (ref 96–112)
Creatinine, Ser: 1.22 mg/dL (ref 0.40–1.50)
GFR: 68.65 mL/min (ref >60.00)
Glucose, Bld: 171 mg/dL — ABNORMAL HIGH (ref 70–99)
Potassium: 3.7 mEq/L (ref 3.5–5.1)
Sodium: 137 mEq/L (ref 135–145)
Total Bilirubin: 0.8 mg/dL (ref 0.2–1.2)
Total Protein: 8.1 g/dL (ref 6.0–8.3)

## 2022-01-23 LAB — CBC WITH DIFFERENTIAL/PLATELET
Basophils Absolute: 0 10*3/uL (ref 0.0–0.1)
Basophils Relative: 0.7 % (ref 0.0–3.0)
Eosinophils Absolute: 0 10*3/uL (ref 0.0–0.7)
Eosinophils Relative: 0.8 % (ref 0.0–5.0)
HCT: 42.1 % (ref 39.0–52.0)
Hemoglobin: 14.9 g/dL (ref 13.0–17.0)
Lymphocytes Relative: 47.2 % — ABNORMAL HIGH (ref 12.0–46.0)
Lymphs Abs: 2 10*3/uL (ref 0.7–4.0)
MCHC: 35.5 g/dL (ref 30.0–36.0)
MCV: 80.2 fl (ref 78.0–100.0)
Monocytes Absolute: 0.7 10*3/uL (ref 0.1–1.0)
Monocytes Relative: 16.9 % — ABNORMAL HIGH (ref 3.0–12.0)
Neutro Abs: 1.5 10*3/uL (ref 1.4–7.7)
Neutrophils Relative %: 34.4 % — ABNORMAL LOW (ref 43.0–77.0)
Platelets: 254 10*3/uL (ref 150.0–400.0)
RBC: 5.24 Mil/uL (ref 4.22–5.81)
RDW: 14.6 % (ref 11.5–15.5)
WBC: 4.2 10*3/uL (ref 4.0–10.5)

## 2022-01-23 LAB — TSH: TSH: 1.95 u[IU]/mL (ref 0.35–5.50)

## 2022-01-23 NOTE — Patient Instructions (Addendum)
Your provider has requested that you go to the basement level for lab work before leaving today. Press "B" on the elevator. The lab is located at the first door on the left as you exit the elevator.   You will be contacted by [facility] in the next 2 days to arrange an ultrasound.  The number on your caller ID will be [phone], please answer when they call.  If you have not heard from them in 2 days please call [phone] to schedule.     First do a trial off milk/lactose products if you use them.  Add fiber like benefiber or citracel once a day Increase activity Can do trial of IBGard which is over the counter for AB pain- Take 1-2 capsules once a day for maintence or twice a day during a flare Please try to decrease stress. consider talking with PCP about anti anxiety medication or try head space app for meditation. if any worsening symptoms like blood in stool, weight loss, please call the office   Please try low FODMAP diet- see below- start with eliminating just one column at a time, the table at the very bottom contains foods that are safe to take   FODMAP stands for fermentable oligo-, di-, mono-saccharides and polyols (1). These are the scientific terms used to classify groups of carbs that are notorious for triggering digestive symptoms like bloating, gas and stomach pain.

## 2022-01-27 LAB — IGG: IgG (Immunoglobin G), Serum: 1821 mg/dL — ABNORMAL HIGH (ref 600–1640)

## 2022-01-27 LAB — ANTI-SMOOTH MUSCLE ANTIBODY, IGG: Actin (Smooth Muscle) Antibody (IGG): <20 U (ref <20)

## 2022-01-27 LAB — MITOCHONDRIAL ANTIBODIES: Mitochondrial M2 Ab, IgG: <=20 U (ref <=20.0)

## 2022-01-29 NOTE — Progress Notes (Deleted)
   HPI:  Mr.Eric Adams is a 51 y.o. male, who is here today to follow on recent visit.  Review of Systems See other pertinent positives and negatives in HPI.  Current Outpatient Medications on File Prior to Visit  Medication Sig Dispense Refill   amLODipine-benazepril (LOTREL) 5-10 MG capsule TAKE 1 CAPSULE BY MOUTH EVERY DAY (Patient not taking: Reported on 01/23/2022) 90 capsule 3   atorvastatin (LIPITOR) 20 MG tablet Take 1 tablet (20 mg total) by mouth daily. 90 tablet 2   hydrocortisone (ANUSOL-HC) 25 MG suppository Place 1 suppository (25 mg total) rectally 2 (two) times daily. (Patient not taking: Reported on 10/31/2021) 12 suppository 0   ONE TOUCH LANCETS MISC USE TO CHECK BLOOD SUGAR TWICE A DAY AND PRN (Patient not taking: Reported on 01/23/2022) 100 each 6   ONETOUCH VERIO test strip USE TO CHECK BLOOD SUGAR TWICE A DAY AND AS NEEDED (Patient not taking: Reported on 01/23/2022) 678 each 0   TRULICITY 9.38 BO/1.7PZ SOPN Inject into the skin. (Patient not taking: Reported on 10/31/2021)     Current Facility-Administered Medications on File Prior to Visit  Medication Dose Route Frequency Provider Last Rate Last Admin   0.9 %  sodium chloride infusion  500 mL Intravenous Once Pyrtle, Lajuan Lines, MD        Past Medical History:  Diagnosis Date   Diabetes mellitus without complication (HCC)    H. pylori infection    Hepatic steatosis    Hyperlipidemia    no med currently   Hypertension    no med currently   Sensorineural hearing loss, bilateral    Sleep apnea    uses cpap nightly   Tubular adenoma of colon    No Known Allergies  Social History   Socioeconomic History   Marital status: Married    Spouse name: Not on file   Number of children: 1   Years of education: Not on file   Highest education level: Not on file  Occupational History   Occupation: Truck Education administrator: [employer]  Tobacco Use   Smoking status: Never   Smokeless tobacco: Never  Vaping  Use   Vaping Use: Never used  Substance and Sexual Activity   Alcohol use: Yes    Comment: glass of wine occasionally   Drug use: No   Sexual activity: Yes    Birth control/protection: None  Other Topics Concern   Not on file  Social History Narrative   Not on file   Social Determinants of Health   Financial Resource Strain: Not on file  Food Insecurity: Not on file  Transportation Needs: Not on file  Physical Activity: Not on file  Stress: Not on file  Social Connections: Not on file    There were no vitals filed for this visit. There is no height or weight on file to calculate BMI.  Physical Exam  ASSESSMENT AND PLAN:  There are no diagnoses linked to this encounter.  No orders of the defined types were placed in this encounter.   No problem-specific Assessment & Plan notes found for this encounter.   No follow-ups on file.  Betty G. Martinique, MD  [facility]. [place] office.

## 2022-01-30 ENCOUNTER — Ambulatory Visit: Payer: Self-pay | Admitting: Family Medicine

## 2022-01-30 ENCOUNTER — Other Ambulatory Visit: Payer: Commercial Managed Care - PPO

## 2022-01-30 DIAGNOSIS — R195 Other fecal abnormalities: Secondary | ICD-10-CM

## 2022-01-30 DIAGNOSIS — R1084 Generalized abdominal pain: Secondary | ICD-10-CM

## 2022-01-30 DIAGNOSIS — K760 Fatty (change of) liver, not elsewhere classified: Secondary | ICD-10-CM

## 2022-01-31 LAB — FECAL LACTOFERRIN, QUANT
Fecal Lactoferrin: POSITIVE — AB
MICRO NUMBER:: 14278670
SPECIMEN QUALITY:: ADEQUATE

## 2022-02-02 LAB — FECAL FAT, QUALITATIVE
Fat Qual Neutral, Stl: NORMAL
Fat Qual Total, Stl: NORMAL

## 2022-02-04 NOTE — Addendum Note (Signed)
Addended by: Vicie Mutters on: 02/04/2022 08:14 AM   Modules accepted: Orders

## 2022-02-08 ENCOUNTER — Other Ambulatory Visit: Payer: Commercial Managed Care - PPO

## 2022-02-08 ENCOUNTER — Ambulatory Visit (HOSPITAL_COMMUNITY): Payer: Commercial Managed Care - PPO

## 2022-02-08 DIAGNOSIS — R195 Other fecal abnormalities: Secondary | ICD-10-CM

## 2022-02-11 ENCOUNTER — Other Ambulatory Visit: Payer: Commercial Managed Care - PPO

## 2022-02-11 DIAGNOSIS — R195 Other fecal abnormalities: Secondary | ICD-10-CM

## 2022-02-13 LAB — GI PROFILE, STOOL, PCR

## 2022-02-13 LAB — OVA AND PARASITE EXAMINATION
CONCENTRATE RESULT:: NONE SEEN
MICRO NUMBER:: 14328044
SPECIMEN QUALITY:: ADEQUATE
TRICHROME RESULT:: NONE SEEN

## 2022-02-14 LAB — CYCLOSPORA AND ISOSPORA EXAMINATION
CYCLOSPORA:: NOT DETECTED
ISOSPORA:: NOT DETECTED
MICRO NUMBER:: 14328045
SPECIMEN QUALITY:: ADEQUATE

## 2022-02-14 LAB — GIARDIA/CRYPTOSPORIDIUM EIA
Cryptosporidium EIA: NEGATIVE
Giardia Ag, Stl: NEGATIVE

## 2022-02-28 LAB — PANCREATIC ELASTASE, FECAL: Pancreatic Elastase-1, Stool: >500 mcg/g

## 2022-10-29 ENCOUNTER — Encounter: Payer: Self-pay | Admitting: Family Medicine

## 2022-10-29 ENCOUNTER — Ambulatory Visit: Payer: 59 | Admitting: Family Medicine

## 2022-10-29 VITALS — BP 150/98 | HR 84 | Temp 97.8°F | Resp 12 | Ht 66.0 in | Wt 244.2 lb

## 2022-10-29 DIAGNOSIS — I10 Essential (primary) hypertension: Secondary | ICD-10-CM | POA: Diagnosis not present

## 2022-10-29 DIAGNOSIS — E1169 Type 2 diabetes mellitus with other specified complication: Secondary | ICD-10-CM | POA: Diagnosis not present

## 2022-10-29 DIAGNOSIS — E785 Hyperlipidemia, unspecified: Secondary | ICD-10-CM | POA: Diagnosis not present

## 2022-10-29 LAB — POCT GLYCOSYLATED HEMOGLOBIN (HGB A1C): Hemoglobin A1C: 9.9 % — AB (ref 4.0–5.6)

## 2022-10-29 MED ORDER — ROSUVASTATIN CALCIUM 10 MG PO TABS
10.0000 mg | ORAL_TABLET | Freq: Every day | ORAL | 3 refills | Status: DC
Start: 2022-10-29 — End: 2023-09-16

## 2022-10-29 MED ORDER — AMLODIPINE BESY-BENAZEPRIL HCL 5-10 MG PO CAPS
2.0000 | ORAL_CAPSULE | Freq: Every day | ORAL | Status: DC
Start: 2022-10-29 — End: 2023-09-16

## 2022-10-29 MED ORDER — ROSUVASTATIN CALCIUM 5 MG PO TABS
10.0000 mg | ORAL_TABLET | Freq: Every day | ORAL | Status: DC
Start: 2022-10-29 — End: 2022-10-29

## 2022-10-29 NOTE — Assessment & Plan Note (Addendum)
BP is not well controlled. Possible complications of elevated BP discussed. Changes today:Increased dose of Amlodipine from 5 mg to 10 mg and Benazepril from 10 mg to 20 mg, so he can take 2 caps of medication he has now. Low salt diet. Monitor BP at home am and pm, instructed to let me know about BP's in 3-4 weeks. Eye exam up to date. Instructed about warning signs. Follow-up in 4 months.

## 2022-10-29 NOTE — Assessment & Plan Note (Signed)
Problem is not well controlled. HgA1C went from 11.0 to 9.9, so he can go back to work, [employer]. Treatment was recently adjusted. Follows with endocrinologist.

## 2022-10-29 NOTE — Patient Instructions (Addendum)
A few things to remember from today's visit:  Type 2 diabetes mellitus with other specified complication, without long-term current use of insulin (HCC) - Plan: POC HgB A1c  Benign essential hypertension - Plan: amLODipine-benazepril (LOTREL) 5-10 MG capsule  Hyperlipidemia associated with type 2 diabetes mellitus (HCC) - Plan: rosuvastatin (CRESTOR) 5 MG tablet  Today Rosuvastatin increased from 5 mg to 10 mg daily and Amlodipine-Benazepril from 1 cap to 2 caps daily. If blood pressure is at goal in 3-4 months, will change strength of Amlodipine-Benazepril , so yoyu can take 1 cap daily. Fasting labs in 2 months.  If you need refills for medications you take chronically, please call your pharmacy. Do not use My Chart to request refills or for acute issues that need immediate attention. If you send a my chart message, it may take a few days to be addressed, specially if I am not in the office.  Please be sure medication list is accurate. If a new problem present, please set up appointment sooner than planned today.

## 2022-10-29 NOTE — Progress Notes (Signed)
HPI: Chief Complaint  Patient presents with   Medical Management of Chronic Issues    Patient would like A1c Checked today    [name] is a 52 y.o. male, who is here today for chronic disease management. Last seen on 05/04/2021. No new problems since his last visit. Today he is requesting to have A1c repeated, he saw his endocrinologist on 10/21/2022, hemoglobin A1c was 11.0.  According to patient, treatment was adjusted. Currently he is on Trulicity 1.5 mg weekly. He takes metformin 500 mg 1/2 tablet daily as needed, according to patient, this is per Dr. Daune Adams instructions.  Metformin causes GI side effects. Negative for symptoms of hypoglycemia, polyuria, polydipsia, numbness extremities, foot ulcers/trauma  OSA: He is wearing his CPAP as instructed.  Had eye exam 9 months ago, using prescribed eye drops to "prevent any "glaucoma.  Hypertension: Currently he is on Amlodipine-Benazepril 5-10 mg daily. Home BP ranges 130-140/79.  Negative for unusual or severe headache, visual changes, exertional chest pain, dyspnea,  focal weakness, or edema. Cr 1.3, K+3.9, and e GFR 65 Echo 03/2018 LVEF 50-55% and probable mild diastolic dysfunction;  mild LVH.  Hyperlipidemia:He is on Rosuvastatin 5 mg 3 times per week. He denies side effects. He had FLP done on 10/21/22 TC 221, HDL 44, LDL 137, and TG 226.  Review of Systems  Constitutional:  Negative for activity change, appetite change, chills and fever.  HENT:  Negative for nosebleeds and sore throat.   Respiratory:  Negative for cough and wheezing.   Gastrointestinal:  Negative for abdominal pain, nausea and vomiting.  Endocrine: Negative for cold intolerance and heat intolerance.  Genitourinary:  Negative for decreased urine volume and hematuria.  Skin:  Negative for rash.  Neurological:  Negative for syncope and facial asymmetry.  See other pertinent positives and negatives in HPI.  Current Outpatient Medications on File  Prior to Visit  Medication Sig Dispense Refill   hydrocortisone (ANUSOL-HC) 25 MG suppository Place 1 suppository (25 mg total) rectally 2 (two) times daily. 12 suppository 0   ONE TOUCH LANCETS MISC USE TO CHECK BLOOD SUGAR TWICE A DAY AND PRN 100 each 6   ONETOUCH VERIO test strip USE TO CHECK BLOOD SUGAR TWICE A DAY AND AS NEEDED 100 each 0   Travoprost, BAK Free, (TRAVATAN) 0.004 % SOLN ophthalmic solution SMARTSIG:In Eye(s)     TRULICITY 0.75 MG/0.5ML SOPN Inject into the skin.     Current Facility-Administered Medications on File Prior to Visit  Medication Dose Route Frequency Provider Last Rate Last Admin   0.9 %  sodium chloride infusion  500 mL Intravenous Once Pyrtle, Carie Caddy, MD       Past Medical History:  Diagnosis Date   Diabetes mellitus without complication (HCC)    H. pylori infection    Hepatic steatosis    Hyperlipidemia    no med currently   Hypertension    no med currently   Sensorineural hearing loss, bilateral    Sleep apnea    uses cpap nightly   Tubular adenoma of colon     No Known Allergies  Social History   Socioeconomic History   Marital status: Married    Spouse name: Not on file   Number of children: 1   Years of education: Not on file   Highest education level: Not on file  Occupational History   Occupation: Truck Air traffic controller: [employer]  Tobacco Use   Smoking status: Never  Smokeless tobacco: Never  Vaping Use   Vaping status: Never Used  Substance and Sexual Activity   Alcohol use: Yes    Comment: glass of wine occasionally   Drug use: No   Sexual activity: Yes    Birth control/protection: None  Other Topics Concern   Not on file  Social History Narrative   Not on file   Social Determinants of Health   Financial Resource Strain: Not on file  Food Insecurity: Not on file  Transportation Needs: Not on file  Physical Activity: Not on file  Stress: Not on file  Social Connections: Not on file    Today's Vitals    10/29/22 1526 10/29/22 1612  BP: (!) 152/86 (!) 150/98  Pulse: 84   Resp: 12   Temp: 97.8 F (36.6 C)   TempSrc: Oral   SpO2: 97%   Weight: 244 lb 3.2 oz (110.8 kg)   Height: 5\' 6"  (1.676 m)    Body mass index is 39.41 kg/m.  Physical Exam Vitals and nursing note reviewed.  Constitutional:      General: He is not in acute distress.    Appearance: He is well-developed.  HENT:     Head: Normocephalic and atraumatic.     Mouth/Throat:     Mouth: Mucous membranes are moist.     Pharynx: Oropharynx is clear.  Eyes:     Conjunctiva/sclera: Conjunctivae normal.  Cardiovascular:     Rate and Rhythm: Normal rate and regular rhythm.     Pulses:          Dorsalis pedis pulses are 2+ on the right side and 2+ on the left side.     Heart sounds: No murmur heard. Pulmonary:     Effort: Pulmonary effort is normal. No respiratory distress.     Breath sounds: Normal breath sounds.  Abdominal:     Palpations: Abdomen is soft. There is no hepatomegaly or mass.     Tenderness: There is no abdominal tenderness.  Musculoskeletal:     Right lower leg: No edema.     Left lower leg: No edema.  Lymphadenopathy:     Cervical: No cervical adenopathy.  Skin:    General: Skin is warm.     Findings: No erythema or rash.  Neurological:     Mental Status: He is alert and oriented to person, place, and time.     Cranial Nerves: No cranial nerve deficit.     Gait: Gait normal.  Psychiatric:        Mood and Affect: Mood and affect normal.   ASSESSMENT AND PLAN:  Eric Adams was seen today for medical management of chronic issues.  Diagnoses and all orders for this visit: Orders Placed This Encounter  Procedures   Lipid panel   Hepatic function panel   POC HgB A1c   Lab Results  Component Value Date   HGBA1C 9.9 (A) 10/29/2022   Hyperlipidemia associated with type 2 diabetes mellitus (HCC) Assessment & Plan: LDL was 137 in 09/2022. Recommend increasing dose of Rosuvastatin from 5 mg  3 times per week to 10 mg daily. Continue low fat diet. FLP in 2 months.  Orders: -     Lipid panel; Future -     Hepatic function panel; Future -     Rosuvastatin Calcium; Take 1 tablet (10 mg total) by mouth daily.  Dispense: 90 tablet; Refill: 3  Type 2 diabetes mellitus with other specified complication, without long-term current use of insulin (HCC)  Assessment & Plan: Problem is not well controlled. HgA1C went from 11.0 to 9.9, so he can go back to work, [employer]. Treatment was recently adjusted. Follows with endocrinologist.  Orders: -     POCT glycosylated hemoglobin (Hb A1C)  Benign essential hypertension Assessment & Plan: BP is not well controlled. Possible complications of elevated BP discussed. Changes today:Increased dose of Amlodipine from 5 mg to 10 mg and Benazepril from 10 mg to 20 mg, so he can take 2 caps of medication he has now. Low salt diet. Monitor BP at home am and pm, instructed to let me know about BP's in 3-4 weeks. Eye exam up to date. Instructed about warning signs. Follow-up in 4 months.  Orders: -     amLODIPine Besy-Benazepril HCl; Take 2 capsules by mouth daily. TAKE 1 CAPSULE BY MOUTH EVERY DAY   Return in about 4 months (around 02/28/2023) for fasting labs in 2 months., chronic problems.  Shannon Balthazar [place], MD [facility]. [place] office.

## 2022-10-29 NOTE — Assessment & Plan Note (Signed)
LDL was 137 in 09/2022. Recommend increasing dose of Rosuvastatin from 5 mg 3 times per week to 10 mg daily. Continue low fat diet. FLP in 2 months.

## 2022-11-07 ENCOUNTER — Telehealth: Payer: Self-pay | Admitting: Family Medicine

## 2022-11-07 NOTE — Telephone Encounter (Signed)
Pt came in and states he is needing a note form Dr. [place] stating that his blood pressure is under control on the blood pressure medication he is currently taking. Pt provided a letter from [facility] with the details of what he needs in the note from Dr. [place]. Letter has been placed in providers red folder in front office.

## 2022-11-08 ENCOUNTER — Encounter: Payer: Self-pay | Admitting: Family Medicine

## 2022-11-08 NOTE — Telephone Encounter (Signed)
I left a voicemail at pt's mobile number letting him know that the letter is up front for pick up.

## 2022-11-08 NOTE — Telephone Encounter (Signed)
In your bin

## 2022-12-30 ENCOUNTER — Other Ambulatory Visit: Payer: 59

## 2023-01-03 ENCOUNTER — Other Ambulatory Visit: Payer: 59

## 2023-01-08 ENCOUNTER — Other Ambulatory Visit: Payer: 59

## 2023-01-10 ENCOUNTER — Encounter: Payer: Self-pay | Admitting: Family Medicine

## 2023-01-10 ENCOUNTER — Ambulatory Visit: Payer: 59 | Admitting: Family Medicine

## 2023-01-10 ENCOUNTER — Other Ambulatory Visit: Payer: 59

## 2023-01-10 VITALS — BP 132/80 | HR 95 | Resp 12 | Ht 66.0 in | Wt 252.0 lb

## 2023-01-10 DIAGNOSIS — K0889 Other specified disorders of teeth and supporting structures: Secondary | ICD-10-CM

## 2023-01-10 DIAGNOSIS — E1169 Type 2 diabetes mellitus with other specified complication: Secondary | ICD-10-CM

## 2023-01-10 DIAGNOSIS — E785 Hyperlipidemia, unspecified: Secondary | ICD-10-CM

## 2023-01-10 DIAGNOSIS — I10 Essential (primary) hypertension: Secondary | ICD-10-CM

## 2023-01-10 DIAGNOSIS — Z7985 Long-term (current) use of injectable non-insulin antidiabetic drugs: Secondary | ICD-10-CM

## 2023-01-10 MED ORDER — HYDROCODONE-ACETAMINOPHEN 5-325 MG PO TABS
1.0000 | ORAL_TABLET | Freq: Every evening | ORAL | 0 refills | Status: AC | PRN
Start: 2023-01-10 — End: 2023-01-17

## 2023-01-10 NOTE — Progress Notes (Signed)
ACUTE VISIT Chief Complaint  Patient presents with   Dental Pain   HPI: Eric Adams is a 52 y.o. male with a PMHx significant for HTN, OSA, fatty liver disease, HLD, and DM II here today complaining of left-sided headache, which he attributes to dental etiology.  He complains of a left-sided temporal headache for the last week. He believes it is related to a cracked tooth. He tried to see a dentist out of his network but had to cancel it for insurance reasons, so he has an appointment with his regular dentist next week. He asks about receiving pain medication until that appointment.  Pain is exacerbated by heat exposure and affecting his sleep.  Dental Pain  This is a new problem. The current episode started 1 to 4 weeks ago. The problem occurs constantly. The problem has been unchanged. The pain is at a severity of 9/10. The pain is moderate. Pertinent negatives include no difficulty swallowing, facial pain, fever, oral bleeding, sinus pressure or thermal sensitivity. He has tried NSAIDs for the symptoms. The treatment provided mild relief.   He rates the headache as an 8-9/10, and says it can be worse when he is lying down, and worse at night.  He has been taking advil for his pain.  He denies difficulty swallowing, visual changes, nausea, vomiting, chest pain, SOB, or palpitations.   Hypertension:Currently on amlodipine-benazepril 5-10 mg daily, he was supposed to take 2 caps daily. BP readings at home: He is checking his BP at home and his readings have been 120s-130s/80s. He says it is above 130/80 at least 60% of the time.  Side effects: none Negative for visual changes, exertional chest pain, dyspnea,  focal weakness, or edema.  Lab Results  Component Value Date   CREATININE 1.22 01/23/2022   BUN 15 01/23/2022   NA 137 01/23/2022   K 3.7 01/23/2022   CL 101 01/23/2022   CO2 28 01/23/2022   Hyperlipidemia: Currently on rosuvastatin 10 mg daily.  Side effects from  medication: none Lab Results  Component Value Date   CHOL 168 11/15/2020   HDL 41.00 11/15/2020   LDLCALC 95 11/15/2020   LDLDIRECT 114.0 02/06/2015   TRIG 161.0 (H) 11/15/2020   CHOLHDL 4 11/15/2020   Diabetes Mellitus II:  Follows with endocrinologist. - His average readings have been 140s-160s.  - Currently on Trulicity 1.5 mg weekly. - He has not been consistent with following a healthful diet but does try to avoid salt.  - He does not have an exercise routine but he walks regularly at work.  - His insurance no longer covers his former eye care provider, so he needs to find another one that is covered.   Lab Results  Component Value Date   HGBA1C 9.9 (A) 10/29/2022   Review of Systems  Constitutional:  Negative for fever.  HENT:  Negative for sinus pressure, sore throat and trouble swallowing.   Respiratory:  Negative for cough and wheezing.   Gastrointestinal:  Negative for abdominal pain, nausea and vomiting.  Genitourinary:  Negative for decreased urine volume, dysuria and hematuria.  Skin:  Negative for rash.  Neurological:  Negative for syncope and facial asymmetry.  See other pertinent positives and negatives in HPI.  Current Outpatient Medications on File Prior to Visit  Medication Sig Dispense Refill   amLODipine-benazepril (LOTREL) 5-10 MG capsule Take 2 capsules by mouth daily. TAKE 1 CAPSULE BY MOUTH EVERY DAY     hydrocortisone (ANUSOL-HC) 25 MG suppository Place  1 suppository (25 mg total) rectally 2 (two) times daily. 12 suppository 0   ONE TOUCH LANCETS MISC USE TO CHECK BLOOD SUGAR TWICE A DAY AND PRN 100 each 6   ONETOUCH VERIO test strip USE TO CHECK BLOOD SUGAR TWICE A DAY AND AS NEEDED 100 each 0   rosuvastatin (CRESTOR) 10 MG tablet Take 1 tablet (10 mg total) by mouth daily. 90 tablet 3   Travoprost, BAK Free, (TRAVATAN) 0.004 % SOLN ophthalmic solution SMARTSIG:In Eye(s)     TRULICITY 0.75 MG/0.5ML SOPN Inject into the skin.     Current  Facility-Administered Medications on File Prior to Visit  Medication Dose Route Frequency Provider Last Rate Last Admin   0.9 %  sodium chloride infusion  500 mL Intravenous Once Pyrtle, Carie Caddy, MD        Past Medical History:  Diagnosis Date   Diabetes mellitus without complication (HCC)    H. pylori infection    Hepatic steatosis    Hyperlipidemia    no med currently   Hypertension    no med currently   Sensorineural hearing loss, bilateral    Sleep apnea    uses cpap nightly   Tubular adenoma of colon    No Known Allergies  Social History   Socioeconomic History   Marital status: Married    Spouse name: Not on file   Number of children: 1   Years of education: Not on file   Highest education level: Not on file  Occupational History   Occupation: Truck Air traffic controller: [employer]  Tobacco Use   Smoking status: Never   Smokeless tobacco: Never  Vaping Use   Vaping status: Never Used  Substance and Sexual Activity   Alcohol use: Yes    Comment: glass of wine occasionally   Drug use: No   Sexual activity: Yes    Birth control/protection: None  Other Topics Concern   Not on file  Social History Narrative   Not on file   Social Determinants of Health   Financial Resource Strain: Not on file  Food Insecurity: Not on file  Transportation Needs: Not on file  Physical Activity: Not on file  Stress: Not on file  Social Connections: Not on file   Vitals:   01/10/23 1438  BP: 132/80  Pulse: 95  Resp: 12  SpO2: 94%   Wt Readings from Last 3 Encounters:  01/10/23 252 lb (114.3 kg)  10/29/22 244 lb 3.2 oz (110.8 kg)  01/23/22 257 lb 6 oz (116.7 kg)  Body mass index is 40.67 kg/m.  Physical Exam Vitals and nursing note reviewed.  Constitutional:      General: He is not in acute distress.    Appearance: He is well-developed.  HENT:     Head: Normocephalic and atraumatic.     Mouth/Throat:     Mouth: Mucous membranes are moist.     Dentition: No  dental tenderness.     Pharynx: No pharyngeal swelling or posterior oropharyngeal erythema.   Eyes:     Conjunctiva/sclera: Conjunctivae normal.  Cardiovascular:     Rate and Rhythm: Normal rate and regular rhythm.     Pulses:          Dorsalis pedis pulses are 2+ on the right side and 2+ on the left side.     Heart sounds: No murmur heard.    Comments: Trace pitting LE edema, bilateral. Pulmonary:     Effort: Pulmonary effort is normal. No  respiratory distress.     Breath sounds: Normal breath sounds.  Abdominal:     Palpations: Abdomen is soft. There is no hepatomegaly or mass.     Tenderness: There is no abdominal tenderness.  Lymphadenopathy:     Cervical: No cervical adenopathy.  Skin:    General: Skin is warm.     Findings: No erythema or rash.  Neurological:     General: No focal deficit present.     Mental Status: He is alert and oriented to person, place, and time.     Cranial Nerves: No cranial nerve deficit.     Gait: Gait normal.  Psychiatric:        Mood and Affect: Mood and affect normal.   ASSESSMENT AND PLAN:  Mr. Admire was seen today for toothache,headache,and follow up.  Lab Results  Component Value Date   CHOL 202 (H) 01/10/2023   HDL 48 01/10/2023   LDLCALC 118 (H) 01/10/2023   LDLDIRECT 114.0 02/06/2015   TRIG 204 (H) 01/10/2023   CHOLHDL 4.2 01/10/2023   Lab Results  Component Value Date   NA 142 01/10/2023   CL 101 01/10/2023   K 4.2 01/10/2023   CO2 23 01/10/2023   BUN 17 01/10/2023   CREATININE 1.33 (H) 01/10/2023   EGFR 64 01/10/2023   CALCIUM 9.5 01/10/2023   ALBUMIN 4.7 01/10/2023   GLUCOSE 105 (H) 01/10/2023   Lab Results  Component Value Date   ALT 24 01/10/2023   AST 20 01/10/2023   ALKPHOS 122 (H) 01/10/2023   BILITOT 0.7 01/10/2023   Toothache He has an appt with his new dentist next week. Hydrocodone-Acetaminophen recommended for pain management. Side effects discussed. Instructed about warning signs.  -      HYDROcodone-Acetaminophen; Take 1 tablet by mouth at bedtime as needed for up to 7 days for moderate pain (pain score 4-6).  Dispense: 7 tablet; Refill: 0  Hyperlipidemia associated with type 2 diabetes mellitus (HCC) Assessment & Plan: Continue rosuvastatin 10 mg daily and low-fat diet. Further recommendation will be given according to lipid panel results.  Orders: -     Lipid panel; Future -     Comprehensive metabolic panel; Future -     Lipid panel; Future  Benign essential hypertension Assessment & Plan: BP mildly elevated, about 3 times per week BP is > 130/80. He is taking Lotrel 5-10 mg 1 cap daily instead 2 caps daily,recommend increasing to 2 caps daily. Low salt diet to continue. Instructed to let me know about BP readings in 3 to 4 weeks. Eye exam is due. Follow-up in 6 months.  Orders: -     Comprehensive metabolic panel; Future  Morbid obesity [facility]) Assessment & Plan: He has gained about 8 Lb since his last visit in 10/2022. Patient understands the benefits of wt loss as well as adverse effects of obesity. Consistency with healthy diet and physical activity encouraged.  Return in about 6 months (around 07/10/2023).  I, Rolla Etienne Wierda, acting as a scribe for Lorrain Rivers [place], MD., have documented all relevant documentation on the behalf of Jatavian Calica [place], MD, as directed by  Kingdavid Leinbach [place], MD while in the presence of Quandra Fedorchak [place], MD.   I, Bennett Ram [place], MD, have reviewed all documentation for this visit. The documentation on 01/11/23 for the exam, diagnosis, procedures, and orders are all accurate and complete.  Dalaya Suppa G. [place], MD  [facility]. [place] office.

## 2023-01-10 NOTE — Addendum Note (Signed)
Addended by: Kathreen Devoid on: 01/10/2023 03:18 PM   Modules accepted: Orders

## 2023-01-10 NOTE — Assessment & Plan Note (Signed)
Continue rosuvastatin 10 mg daily and low-fat diet. Further recommendation will be given according to lipid panel results.

## 2023-01-10 NOTE — Addendum Note (Signed)
Addended by: Sumner Boast on: 01/10/2023 03:24 PM   Modules accepted: Orders

## 2023-01-10 NOTE — Assessment & Plan Note (Addendum)
BP mildly elevated, about 3 times per week BP is > 130/80. He is taking Lotrel 5-10 mg 1 cap daily instead 2 caps daily,recommend increasing to 2 caps daily. Low salt diet to continue. Instructed to let me know about BP readings in 3 to 4 weeks. Eye exam is due. Follow-up in 6 months.

## 2023-01-10 NOTE — Patient Instructions (Addendum)
A few things to remember from today's visit:  Mixed hyperlipidemia  Benign essential hypertension  Toothache - Plan: HYDROcodone-acetaminophen (NORCO/VICODIN) 5-325 MG tablet Take cap of Amlodipine-Benazepril 2 at the same time. Pain medication for toothache at bedtime. Monitor blood pressure and let me know about readings in 3-4 weeks. Keep appt with dentist.  If you need refills for medications you take chronically, please call your pharmacy. Do not use My Chart to request refills or for acute issues that need immediate attention. If you send a my chart message, it may take a few days to be addressed, specially if I am not in the office.  Please be sure medication list is accurate. If a new problem present, please set up appointment sooner than planned today.

## 2023-01-10 NOTE — Addendum Note (Signed)
Addended by: Kathreen Devoid on: 01/10/2023 03:25 PM   Modules accepted: Orders

## 2023-01-11 NOTE — Assessment & Plan Note (Addendum)
He has gained about 8 Lb since his last visit in 10/2022. Patient understands the benefits of wt loss as well as adverse effects of obesity. Consistency with healthy diet and physical activity encouraged.

## 2023-01-14 LAB — COMPREHENSIVE METABOLIC PANEL
ALT: 24 [IU]/L (ref 0–44)
AST: 20 [IU]/L (ref 0–40)
Albumin: 4.7 g/dL (ref 3.8–4.9)
Alkaline Phosphatase: 122 [IU]/L — ABNORMAL HIGH (ref 44–121)
BUN/Creatinine Ratio: 13 (ref 9–20)
BUN: 17 mg/dL (ref 6–24)
Bilirubin Total: 0.7 mg/dL (ref 0.0–1.2)
CO2: 23 mmol/L (ref 20–29)
Calcium: 9.5 mg/dL (ref 8.7–10.2)
Chloride: 101 mmol/L (ref 96–106)
Creatinine, Ser: 1.33 mg/dL — ABNORMAL HIGH (ref 0.76–1.27)
Globulin, Total: 3.5 g/dL (ref 1.5–4.5)
Glucose: 105 mg/dL — ABNORMAL HIGH (ref 70–99)
Potassium: 4.2 mmol/L (ref 3.5–5.2)
Sodium: 142 mmol/L (ref 134–144)
Total Protein: 8.2 g/dL (ref 6.0–8.5)
eGFR: 64 mL/min/{1.73_m2} (ref >59)

## 2023-01-14 LAB — LIPID PANEL
Chol/HDL Ratio: 4.2 ratio (ref 0.0–5.0)
Cholesterol, Total: 202 mg/dL — ABNORMAL HIGH (ref 100–199)
HDL: 48 mg/dL (ref >39)
LDL Chol Calc (NIH): 118 mg/dL — ABNORMAL HIGH (ref 0–99)
Triglycerides: 204 mg/dL — ABNORMAL HIGH (ref 0–149)
VLDL Cholesterol Cal: 36 mg/dL (ref 5–40)

## 2023-03-03 ENCOUNTER — Ambulatory Visit: Payer: 59 | Admitting: Family Medicine

## 2023-09-16 ENCOUNTER — Ambulatory Visit (HOSPITAL_COMMUNITY)
Admission: RE | Admit: 2023-09-16 | Discharge: 2023-09-16 | Disposition: A | Discharge status: Home / Self Care | Payer: Self-pay | Source: Ambulatory Visit | Attending: Internal Medicine | Admitting: Internal Medicine

## 2023-09-16 ENCOUNTER — Encounter (HOSPITAL_COMMUNITY): Payer: Self-pay

## 2023-09-16 ENCOUNTER — Ambulatory Visit (INDEPENDENT_AMBULATORY_CARE_PROVIDER_SITE_OTHER)

## 2023-09-16 VITALS — BP 157/101 | HR 83 | Temp 98.0°F | Resp 18

## 2023-09-16 DIAGNOSIS — R051 Acute cough: Secondary | ICD-10-CM

## 2023-09-16 DIAGNOSIS — J209 Acute bronchitis, unspecified: Secondary | ICD-10-CM | POA: Diagnosis not present

## 2023-09-16 DIAGNOSIS — H6122 Impacted cerumen, left ear: Secondary | ICD-10-CM

## 2023-09-16 DIAGNOSIS — R062 Wheezing: Secondary | ICD-10-CM | POA: Diagnosis not present

## 2023-09-16 MED ORDER — ALBUTEROL SULFATE (2.5 MG/3ML) 0.083% IN NEBU
INHALATION_SOLUTION | RESPIRATORY_TRACT | Status: AC
  Filled 2023-09-16: qty 3

## 2023-09-16 MED ORDER — AZITHROMYCIN 250 MG PO TABS: 250.0000 mg | ORAL_TABLET | Freq: Every day | ORAL | 0 refills | Status: DC

## 2023-09-16 MED ORDER — ALBUTEROL SULFATE HFA 108 (90 BASE) MCG/ACT IN AERS: 2.0000 | INHALATION_SPRAY | RESPIRATORY_TRACT | 0 refills | Status: DC | PRN

## 2023-09-16 MED ORDER — ALBUTEROL SULFATE (2.5 MG/3ML) 0.083% IN NEBU
2.5000 mg | INHALATION_SOLUTION | Freq: Once | RESPIRATORY_TRACT | Status: AC
  Administered 2023-09-16: 2.5 mg via RESPIRATORY_TRACT

## 2023-09-16 MED ORDER — BENZONATATE 200 MG PO CAPS: 200.0000 mg | ORAL_CAPSULE | Freq: Three times a day (TID) | ORAL | 0 refills | Status: DC | PRN

## 2023-09-16 NOTE — ED Triage Notes (Signed)
 Pt c/o dry cough x3 wks. Worse laying down. Taken Nyquil and dayquil with no relief.

## 2023-09-16 NOTE — ED Provider Notes (Signed)
 [facility]    CSN: [id number] Arrival date & time: 09/16/23  0805      History   Chief Complaint Chief Complaint  Patient presents with   appt- cough    HPI Eric Adams is a 53 y.o. male who presents with onset of cough that starts with a tickle on his throat. He denies having rhinitis, fever, body aches when he started this. Has hx of Cpap and has not been wearing his machine in 3 weeks. His cough is non productive and has been wheezing. He denies edema or orthopnea. Denies sneezing, itchy ears or throat.     Past Medical History:  Diagnosis Date   Diabetes mellitus without complication (HCC)    H. pylori infection    Hepatic steatosis    Hyperlipidemia    no med currently   Hypertension    no med currently   Sensorineural hearing loss, bilateral    Sleep apnea    uses cpap nightly   Tubular adenoma of colon     Patient Active Problem List   Diagnosis Date Noted   Mixed hyperlipidemia 11/15/2020   Benign essential hypertension 05/20/2018   Hyperlipidemia associated with type 2 diabetes mellitus (HCC) 05/20/2018   Morbid obesity (HCC) 08/30/2014   Type 2 diabetes mellitus with other specified complication (HCC) 08/22/2014   Obstructive sleep apnea 09/02/2013   H. pylori infection 03/22/2013   Fatty liver disease, nonalcoholic 03/02/2013    Past Surgical History:  Procedure Laterality Date   BREATH TEK H PYLORI N/A 12/21/2012   Procedure: BREATH SHIRLEAN VEAR LORA;  Surgeon: Gordy CHRISTELLA Starch, MD;  Location: [facility];  Service: Gastroenterology;  Laterality: N/A;   COLONOSCOPY  07/2015   PYRTLE - TA X 3   FOOT SURGERY Right        Home Medications    Prior to Admission medications   Medication Sig Start Date End Date Taking? Authorizing Provider  albuterol  (VENTOLIN  HFA) 108 (90 Base) MCG/ACT inhaler Inhale 2 puffs into the lungs every 4 (four) hours as needed for wheezing or shortness of breath. For 3 days, then prn cough attacks or wheezing  09/16/23  Yes Rodriguez-Southworth, Kyra, PA-C  azithromycin  (ZITHROMAX ) 250 MG tablet Take 1 tablet (250 mg total) by mouth daily. Take first 2 tablets together, then 1 every day until finished. 09/16/23  Yes Rodriguez-Southworth, Saharah Sherrow, PA-C  benzonatate  (TESSALON ) 200 MG capsule Take 1 capsule (200 mg total) by mouth 3 (three) times daily as needed for cough. 09/16/23  Yes Rodriguez-Southworth, Jurgen Groeneveld, PA-C  hydrocortisone  (ANUSOL -HC) 25 MG suppository Place 1 suppository (25 mg total) rectally 2 (two) times daily. 03/28/21   Nafziger, Darleene, NP  ONE TOUCH LANCETS MISC USE TO CHECK BLOOD SUGAR TWICE A DAY AND PRN 08/30/14   Jame Maude FALCON, MD  [facility] VERIO test strip USE TO CHECK BLOOD SUGAR TWICE A DAY AND AS NEEDED 06/25/17   Jame Maude FALCON, MD  Travoprost, BAK Free, (TRAVATAN) 0.004 % SOLN ophthalmic solution SMARTSIG:In Eye(s) 10/28/22   [provider]    Family History Family History  Problem Relation Age of Onset   Colon cancer Neg Hx    Diabetes Neg Hx    Heart disease Neg Hx    Hypertension Neg Hx    Kidney disease Neg Hx    Rectal cancer Neg Hx    Stomach cancer Neg Hx     Social History Social History   Tobacco Use   Smoking status: Never  Smokeless tobacco: Never  Vaping Use   Vaping status: Never Used  Substance Use Topics   Alcohol use: Yes    Comment: glass of wine occasionally   Drug use: No     Allergies   Patient has no known allergies.   Review of Systems Review of Systems As noted in HPI  Physical Exam Triage Vital Signs ED Triage Vitals  Encounter Vitals Group     BP 09/16/23 0825 (!) 157/101     Girls Systolic BP Percentile --      Girls Diastolic BP Percentile --      Boys Systolic BP Percentile --      Boys Diastolic BP Percentile --      Pulse Rate 09/16/23 0825 83     Resp 09/16/23 0825 18     Temp 09/16/23 0825 98 F (36.7 C)     Temp Source 09/16/23 0825 Oral     SpO2 09/16/23 0825 95 %     Weight --      Height  --      Head Circumference --      Peak Flow --      Pain Score 09/16/23 0826 0     Pain Loc --      Pain Education --      Exclude from Growth Chart --    No data found.  Updated Vital Signs BP (!) 157/101 (BP Location: Left Arm)   Pulse 83   Temp 98 F (36.7 C) (Oral)   Resp 18   SpO2 95%   Visual Acuity Right Eye Distance:   Left Eye Distance:   Bilateral Distance:    Right Eye Near:   Left Eye Near:    Bilateral Near:     Physical Exam HENT:     Right Ear: Tympanic membrane, ear canal and external ear normal.     Left Ear: There is impacted cerumen.     Ears:     Comments: TM after lavage is intact Cardiovascular:     Rate and Rhythm: Normal rate and regular rhythm.     Heart sounds: No murmur heard. Pulmonary:     Effort: Pulmonary effort is normal.     Breath sounds: Wheezing present. No rhonchi.     Comments: Has mild rales on LLL after duo neb, but the wheezing had resolved.  Chest:     Chest wall: No tenderness.  Musculoskeletal:     Right lower leg: No edema.     Left lower leg: No edema.    Physical Exam Constitutional:      General: He is not in acute distress.    Appearance: He is not toxic-appearing.  HENT:     Head: Normocephalic.     Right Ear: Tympanic membrane, ear canal and external ear normal.     Left Ear: Ear canal and external ear normal.     Nose: Nose normal.     Mouth/Throat:     Mouth: Mucous membranes are moist.     Pharynx: Oropharynx is clear.  Eyes:     General: No scleral icterus.    Conjunctiva/sclera: Conjunctivae normal.  Cardiovascular:     Rate and Rhythm: Normal rate and regular rhythm.     Heart sounds: No murmur heard.   Pulmonary:     Effort: Pulmonary effort is normal. No respiratory distress.     Breath sounds: Wheezing present.     Comments: Has auditory wheezing with exhalation.  Musculoskeletal:  General: Normal range of motion.     Cervical back: Neck supple.  Lymphadenopathy:     Cervical:  No cervical adenopathy.  Skin:    General: Skin is warm and dry.     Findings: No rash.  Neurological:     Mental Status: He is alert and oriented to person, place, and time.     Gait: Gait normal.  Psychiatric:        Mood and Affect: Mood normal.        Behavior: Behavior normal.        Thought Content: Thought content normal.        Judgment: Judgment normal.    UC Treatments / Results  Labs (all labs ordered are listed, but only abnormal results are displayed) Labs Reviewed - No data to display  EKG   Radiology DG Chest 2 View Result Date: 09/16/2023 CLINICAL DATA:  Three-week history of dry cough EXAM: CHEST - 2 VIEW COMPARISON:  Chest radiograph dated 03/11/2018 FINDINGS: Normal lung volumes. No focal consolidations. No pleural effusion or pneumothorax. The heart size and mediastinal contours are within normal limits. No acute osseous abnormality. IMPRESSION: No active cardiopulmonary disease. Electronically Signed   By: Limin  Xu M.D.   On: 09/16/2023 09:49    Procedures Procedures (including critical care time)  Medications Ordered in UC Medications  albuterol  (PROVENTIL ) (2.5 MG/3ML) 0.083% nebulizer solution 2.5 mg (2.5 mg Nebulization Given 09/16/23 0924)    Initial Impression / Assessment and Plan / UC Course  I have reviewed the triage vital signs and the nursing notes.  Pertinent imaging results that were available during my care of the patient were reviewed by me and considered in my medical decision making (see chart for details).  Acute bronchitis  I placed him on Zpack, albuterol  and Tessalon  perless as noted.   See instructions.    Final Clinical Impressions(s) / UC Diagnoses   Final diagnoses:  Acute cough  Wheezing  Impacted cerumen of left ear  Acute bronchitis, unspecified organism   Discharge Instructions   None    ED Prescriptions     Medication Sig Dispense Auth. Provider   azithromycin  (ZITHROMAX ) 250 MG tablet Take 1 tablet (250  mg total) by mouth daily. Take first 2 tablets together, then 1 every day until finished. 6 tablet Rodriguez-Southworth, Kyra, PA-C   benzonatate  (TESSALON ) 200 MG capsule Take 1 capsule (200 mg total) by mouth 3 (three) times daily as needed for cough. 30 capsule Rodriguez-Southworth, Pellegrino Kennard, PA-C   albuterol  (VENTOLIN  HFA) 108 (90 Base) MCG/ACT inhaler Inhale 2 puffs into the lungs every 4 (four) hours as needed for wheezing or shortness of breath. For 3 days, then prn cough attacks or wheezing 8 g Rodriguez-Southworth, Kyra, PA-C      PDMP not reviewed this encounter.   Lindi Kyra, PA-C 09/16/23 1249

## 2023-09-27 LAB — AMB RESULTS CONSOLE CBG: Glucose: 230

## 2023-09-27 LAB — HEMOGLOBIN A1C: Hemoglobin A1C: 12.3

## 2023-09-27 NOTE — Progress Notes (Unsigned)
 Pt has a pcp and no sdoh insecurities. Pt recommended to f/u with pcp about test results.

## 2023-11-24 NOTE — Progress Notes (Signed)
 Pt attended 09/27/23 screening event where his bp was 173/114. Pt documented that he does have a PCP and that he has no SDOH needs at this time. Event staff recommended pt follow up with his PCP about his bp.  Chart review indicated that pt does have a PCP Jordan,Betty MD with [facility] and insurance through [employer].  CHW called pt at 1013 on 11/24/23 to inquire if pt had a chance to f/u with his PCP. CHW made 2nd attempted folow up call on 11/26/2023 at 0918, VM was left for pt asking to return call at his earliest convenience. CHW made 3rd attempted follow up call on 11/26/23 at 1415 on home number, voicemail left asking pt to return call. Letter sent to pt reminding him to follow up with his care provider about his b/p and bp educational material sent. Future follow up to be scheduled per health equity protocol.

## 2024-01-12 ENCOUNTER — Ambulatory Visit: Admitting: Family Medicine

## 2024-01-12 ENCOUNTER — Encounter: Payer: Self-pay | Admitting: Family Medicine

## 2024-01-12 VITALS — BP 140/100 | HR 78 | Temp 98.4°F | Resp 16 | Ht 66.0 in | Wt 241.6 lb

## 2024-01-12 DIAGNOSIS — E785 Hyperlipidemia, unspecified: Secondary | ICD-10-CM | POA: Diagnosis not present

## 2024-01-12 DIAGNOSIS — I10 Essential (primary) hypertension: Secondary | ICD-10-CM

## 2024-01-12 DIAGNOSIS — E1169 Type 2 diabetes mellitus with other specified complication: Secondary | ICD-10-CM | POA: Diagnosis not present

## 2024-01-12 DIAGNOSIS — Z23 Encounter for immunization: Secondary | ICD-10-CM

## 2024-01-12 MED ORDER — ROSUVASTATIN CALCIUM 20 MG PO TABS: 20.0000 mg | ORAL_TABLET | Freq: Every day | ORAL | 3 refills | Status: AC

## 2024-01-12 MED ORDER — AMLODIPINE BESYLATE-VALSARTAN 5-160 MG PO TABS: 1.0000 | ORAL_TABLET | Freq: Every day | ORAL | 1 refills | Status: AC

## 2024-01-12 NOTE — Patient Instructions (Addendum)
 A few things to remember from today's visit:  Benign essential hypertension - Plan: Comprehensive metabolic panel with GFR  Hyperlipidemia associated with type 2 diabetes mellitus (HCC) - Plan: Comprehensive metabolic panel with GFR, rosuvastatin  (CRESTOR ) 20 MG tablet  Resume Rosuvastatin  at 20 mg daily. Blood pressure med changed. Blood pressure readings in 3-4 weeks.  If you need refills for medications you take chronically, please call your pharmacy. Do not use My Chart to request refills or for acute issues that need immediate attention. If you send a my chart message, it may take a few days to be addressed, specially if I am not in the office.  Please be sure medication list is accurate. If a new problem present, please set up appointment sooner than planned today.

## 2024-01-12 NOTE — Assessment & Plan Note (Signed)
 Problem is not well-controlled, he has not been taking his antihypertensive medication in a few weeks. His wife takes valsartan, he is interested in trying this medication. Recommend Amlodipine -losartan  5-160 mg daily. Continue monitoring BP at home, instructed to let me know about BP readings in 3 to 4 weeks. Continue low-salt diet. Follow-up in 4 to 5 months.

## 2024-01-12 NOTE — Assessment & Plan Note (Deleted)
 Last LDL was 118 in 12/2022. He has not been on rosuvastatin  for about 3 months and he is not fasting today. Rosuvastatin  20 mg daily recommended as well as low-fat diet. We will plan on checking fasting lipid panel at next visit.

## 2024-01-12 NOTE — Assessment & Plan Note (Signed)
 Last LDL was 118 in 12/2022. He has not been on rosuvastatin  for about 3 months and he is not fasting today. Rosuvastatin  20 mg daily recommended as well as low-fat diet. We will plan on checking fasting lipid panel at next visit.

## 2024-01-12 NOTE — Progress Notes (Signed)
 "  Chief Complaint  Patient presents with   Medical Management of Chronic Issues    Pt is here for BP. She reports BP is high and runs out BP. Pt reports wife is taking BP med and is working for her. Valsartan / hydro   Discussed the use of AI scribe software for clinical note transcription with the patient, who gave verbal consent to proceed.  History of Present Illness Eric Adams is a 53 year old male with past medical history significant for hypertension, OSA, hyperlipidemia, and DM2 here today for chronic disease management. Last seen on 01/10/2023 for acute visit, dental pain. Since his last visit he has followed with his endocrinologist, Dr. Faythe.  HLD: He ran out of  rosuvastatin  10 mg daily, about two to three months ago.  Lab Results  Component Value Date   CHOL 202 (H) 01/10/2023   HDL 48 01/10/2023   LDLCALC 118 (H) 01/10/2023   LDLDIRECT 114.0 02/06/2015   TRIG 204 (H) 01/10/2023   CHOLHDL 4.2 01/10/2023   He reports that his DM II is better controlled since started on Trulicity and uses a continuous glucose monitor, which he finds helpful in managing his diet. He has adjusted his diet to avoid carbohydrates and high-sugar fruits, opting for protein-rich foods like cashews, pistachios, and fish. This dietary change has helped stabilize his blood sugar levels.  He received a flu shot last month.  Hypertension: He has not been on his antihypertensive medication for about 3 months, he was on Amlodipine -benazepril  5-10 mg daily. He feels like medication was not effective because BP's were still elevated. His wife recommended a different medication, valsartan  and hydrochlorothiazide, which have worked well for her. Negative for unusual or severe headache, visual changes, exertional chest pain, dyspnea,  focal weakness, or edema.  Lab Results  Component Value Date   CREATININE 1.33 (H) 01/10/2023   BUN 17 01/10/2023   NA 142 01/10/2023   K 4.2 01/10/2023   CL 101  01/10/2023   CO2 23 01/10/2023   Review of Systems  Constitutional:  Negative for activity change, appetite change and fever.  HENT:  Negative for nosebleeds and sore throat.   Respiratory:  Negative for cough and wheezing.   Gastrointestinal:  Negative for abdominal pain, nausea and vomiting.  Genitourinary:  Negative for decreased urine volume, dysuria and hematuria.  Neurological:  Negative for syncope and facial asymmetry.  Psychiatric/Behavioral:  Negative for confusion. The patient is not nervous/anxious.   See other pertinent positives and negatives in HPI.  Current Outpatient Medications on File Prior to Visit  Medication Sig Dispense Refill   Continuous Glucose Sensor (FREESTYLE LIBRE 3 PLUS SENSOR) MISC Apply topically.     TRULICITY 3 MG/0.5ML SOAJ SMARTSIG:3 Milligram(s) SUB-Q Once a Week     ONE TOUCH LANCETS MISC USE TO CHECK BLOOD SUGAR TWICE A DAY AND PRN (Patient not taking: Reported on 01/12/2024) 100 each 6   ONETOUCH VERIO test strip USE TO CHECK BLOOD SUGAR TWICE A DAY AND AS NEEDED (Patient not taking: Reported on 01/12/2024) 100 each 0   Current Facility-Administered Medications on File Prior to Visit  Medication Dose Route Frequency Provider Last Rate Last Admin   0.9 %  sodium chloride  infusion  500 mL Intravenous Once Pyrtle, Gordy HERO, MD        Past Medical History:  Diagnosis Date   Diabetes mellitus without complication (HCC)    H. pylori infection    Hepatic steatosis    Hyperlipidemia  no med currently   Hypertension    no med currently   Sensorineural hearing loss, bilateral    Sleep apnea    uses cpap nightly   Tubular adenoma of colon     No Known Allergies  Social History   Socioeconomic History   Marital status: Married    Spouse name: Not on file   Number of children: 1   Years of education: Not on file   Highest education level: Not on file  Occupational History   Occupation: Truck Air Traffic Controller: [employer]  Tobacco  Use   Smoking status: Never   Smokeless tobacco: Never  Vaping Use   Vaping status: Never Used  Substance and Sexual Activity   Alcohol use: Yes    Comment: glass of wine occasionally   Drug use: No   Sexual activity: Yes    Birth control/protection: None  Other Topics Concern   Not on file  Social History Narrative   Not on file   Social Drivers of Health   Financial Resource Strain: Not on file  Food Insecurity: No Food Insecurity (09/27/2023)   Hunger Vital Sign    Worried About Running Out of Food in the Last Year: Never true    Ran Out of Food in the Last Year: Never true  Transportation Needs: No Transportation Needs (09/27/2023)   PRAPARE - Administrator, Civil Service (Medical): No    Lack of Transportation (Non-Medical): No  Physical Activity: Not on file  Stress: Not on file  Social Connections: Not on file    Today's Vitals   01/12/24 1555  BP: (!) 140/100  Pulse: 78  Resp: 16  Temp: 98.4 F (36.9 C)  TempSrc: Oral  SpO2: 98%  Weight: 241 lb 9.6 oz (109.6 kg)  Height: 5' 6 (1.676 m)   Body mass index is 39 kg/m.  Physical Exam Vitals and nursing note reviewed.  Constitutional:      General: He is not in acute distress.    Appearance: He is well-developed.  HENT:     Head: Normocephalic and atraumatic.     Mouth/Throat:     Mouth: Mucous membranes are moist.     Pharynx: Oropharynx is clear.  Eyes:     Conjunctiva/sclera: Conjunctivae normal.  Cardiovascular:     Rate and Rhythm: Normal rate and regular rhythm.     Pulses:          Dorsalis pedis pulses are 2+ on the right side and 2+ on the left side.     Heart sounds: No murmur heard. Pulmonary:     Effort: Pulmonary effort is normal. No respiratory distress.     Breath sounds: Normal breath sounds.  Abdominal:     Palpations: Abdomen is soft. There is no hepatomegaly or mass.     Tenderness: There is no abdominal tenderness.  Musculoskeletal:     Right lower leg: No edema.      Left lower leg: No edema.  Lymphadenopathy:     Cervical: No cervical adenopathy.  Skin:    General: Skin is warm.     Findings: No erythema or rash.  Neurological:     Mental Status: He is alert and oriented to person, place, and time.     Cranial Nerves: No cranial nerve deficit.     Gait: Gait normal.  Psychiatric:        Mood and Affect: Mood and affect normal.   ASSESSMENT AND PLAN:  Mr. Eric Adams was seen today for medical management of chronic issues.  Diagnoses and all orders for this visit: Orders Placed This Encounter  Procedures   Comprehensive metabolic panel with GFR   Lab Results  Component Value Date   NA 139 01/12/2024   CL 102 01/12/2024   K 3.8 01/12/2024   CO2 28 01/12/2024   BUN 11 01/12/2024   CREATININE 1.09 01/12/2024   GFR 77.51 01/12/2024   CALCIUM  9.2 01/12/2024   ALBUMIN 4.6 01/12/2024   GLUCOSE 132 (H) 01/12/2024   Lab Results  Component Value Date   ALT 35 01/12/2024   AST 29 01/12/2024   ALKPHOS 107 01/12/2024   BILITOT 0.8 01/12/2024   Hyperlipidemia associated with type 2 diabetes mellitus (HCC) Assessment & Plan: Last LDL was 118 in 12/2022. He has not been on rosuvastatin  for about 3 months and he is not fasting today. Rosuvastatin  20 mg daily recommended as well as low-fat diet. We will plan on checking fasting lipid panel at next visit.  Orders: -     Comprehensive metabolic panel with GFR; Future -     Rosuvastatin  Calcium ; Take 1 tablet (20 mg total) by mouth daily.  Dispense: 90 tablet; Refill: 3  Benign essential hypertension Assessment & Plan: Problem is not well-controlled, he has not been taking his antihypertensive medication for 3 months. His wife takes valsartan , he is interested in trying this medication. We discussed possible complications of elevated BP. Recommend Amlodipine -valsartan  5-160 mg daily. Continue monitoring BP at home, instructed to let me know about BP readings in 3 to 4 weeks. Continue  low-salt diet. Follow-up in 4 to 5 months.  Orders: -     amLODIPine  Besylate-Valsartan ; Take 1 tablet by mouth daily.  Dispense: 30 tablet; Refill: 1 -     Comprehensive metabolic panel with GFR; Future  Need for pneumococcal vaccine -     Pneumococcal conjugate vaccine 20-valent  Return in about 4 months (around 05/14/2024) for chronic problems.  Ilay Capshaw, MD [facility] office.   "

## 2024-01-13 ENCOUNTER — Telehealth: Payer: Self-pay

## 2024-01-13 LAB — COMPREHENSIVE METABOLIC PANEL WITH GFR
ALT: 35 U/L (ref 0–53)
AST: 29 U/L (ref 0–37)
Albumin: 4.6 g/dL (ref 3.5–5.2)
Alkaline Phosphatase: 107 U/L (ref 39–117)
BUN: 11 mg/dL (ref 6–23)
CO2: 28 meq/L (ref 19–32)
Calcium: 9.2 mg/dL (ref 8.4–10.5)
Chloride: 102 meq/L (ref 96–112)
Creatinine, Ser: 1.09 mg/dL (ref 0.40–1.50)
GFR: 77.51 mL/min (ref >60.00)
Glucose, Bld: 132 mg/dL — ABNORMAL HIGH (ref 70–99)
Potassium: 3.8 meq/L (ref 3.5–5.1)
Sodium: 139 meq/L (ref 135–145)
Total Bilirubin: 0.8 mg/dL (ref 0.2–1.2)
Total Protein: 7.9 g/dL (ref 6.0–8.3)

## 2024-01-13 NOTE — Telephone Encounter (Signed)
 Attempted to reach patient regarding hypertension medication. Left voicemail for patient to return my call.   Copied from CRM [id number]. Topic: Clinical - Medication Question >> Jan 13, 2024 12:07 PM Frederich PARAS wrote: Reason for CRM: pt went in to see dr. Jordan yesterday, he was put on 2 medications, however he did not receive the blood pressure medication.

## 2024-01-14 NOTE — Telephone Encounter (Addendum)
 Chart note reviewed- see below  Problem is not well-controlled, he has not been taking his antihypertensive medication in a few weeks. His wife takes valsartan, he is interested in trying this medication. Recommend Amlodipine -losartan  5-160 mg daily. Continue monitoring BP at home, instructed to let me know about BP readings in 3 to 4 weeks. Continue low-salt diet. Follow-up in 4 to 5 months.    Exforge 5-160mg  showing it was sent to [facility] on [address]. Tried calling both numbers, lm at home number asking for call back. Okay for E2C2 to relay on call back.

## 2024-01-14 NOTE — Telephone Encounter (Signed)
 Patient returned call and has been made aware of medication status

## 2024-01-15 ENCOUNTER — Ambulatory Visit: Payer: Self-pay | Admitting: Family Medicine

## 2024-02-04 ENCOUNTER — Telehealth: Payer: Self-pay

## 2024-02-04 NOTE — Telephone Encounter (Signed)
 His health insurance will not cover if A1C is done sooner than 3 months apart. If he insist, it can be done through a nurse visit, Dx DM II and pt requested. Result can be also faxed to his endocrinologist. Thanks, BJ

## 2024-02-04 NOTE — Telephone Encounter (Signed)
 He can call his endocrinologist, who he sees for DM II. Thanks, BJ

## 2024-02-04 NOTE — Telephone Encounter (Signed)
 Dr. Jordan, patient was seen last month and now needs a A1C for work. Is it okay to order a A1C for him as a nurse visit ?  Copied from CRM #[id number]. Topic: Clinical - Request for Lab/Test Order >> Feb 04, 2024 11:45 AM Carlyon D wrote: Reason for CRM: Pt is calling in regards to him needing to get A1C for his employment. Pt is asking if he can get the order placed and be called when it is placed

## 2024-02-04 NOTE — Telephone Encounter (Signed)
 Patient is requesting that it be done here with us  as well as the endocrinologist. He stated he was able to receive his A1C here in August. Please Advise ?

## 2024-02-05 NOTE — Telephone Encounter (Signed)
 Pt would like a callback from veronica concerning A1c

## 2024-02-05 NOTE — Telephone Encounter (Signed)
 Informed patient of Dr. Gib message. Patient wanted to know how much out of pocket would it cost him and I gave him the billing number. Patient will call me back if he wants to proceed with scheduling a nurse visit for a A1C.

## 2024-02-05 NOTE — Telephone Encounter (Signed)
 Eric Adams   02/05/2024 10:57 AM  Pt is calling back stating the billing number that was provided for pt to call and get an estimate of test did not have any information on pt. Spoke with CAL and was advised to notify pt that Lucienne will call him back

## 2024-02-06 NOTE — Telephone Encounter (Signed)
 Patient was informed that the cost of the HBA1C maybe anywhere between $11-$50 for out of pocket cost, He agreed and wanted to scheduled appointment for 02/09/24.

## 2024-02-09 ENCOUNTER — Ambulatory Visit

## 2024-02-09 ENCOUNTER — Other Ambulatory Visit

## 2024-02-09 DIAGNOSIS — E1169 Type 2 diabetes mellitus with other specified complication: Secondary | ICD-10-CM

## 2024-02-09 LAB — POCT GLYCOSYLATED HEMOGLOBIN (HGB A1C): Hemoglobin A1C: 9.2 % — AB (ref 4.0–5.6)

## 2024-02-09 NOTE — Progress Notes (Signed)
 Patient came into office for POC A1C for his job. Patient is aware that he is paying out of pocket for this testing.

## 2024-02-09 NOTE — Patient Instructions (Signed)
 A1C today was 9.2

## 2024-02-13 ENCOUNTER — Ambulatory Visit: Payer: Self-pay | Admitting: Family Medicine

## 2024-02-13 NOTE — Telephone Encounter (Signed)
 Patient informed of his lab results. No further questions or concerns.

## 2024-03-24 NOTE — Progress Notes (Signed)
 Pt attended 09/27/2023 screening event with BP of 173/114. Pt noted at event that he does have a PCP. At event pt did not indicate any SDOH needs. Pt also noted that he is not a smoker at the event.   Per 60 day f/u chart review pt does have a PCP Eric Adams; [facility] [place]), insurance, and is not a smoker. Pt's last appt with PCP was 01/12/2024 and has an upcoming appt on 06/09/2024. Pt does not indicate any SDOH needs at this time.  No additional pt f/u to be scheduled at this time per health equity protocol.

## 2024-06-09 ENCOUNTER — Ambulatory Visit: Admitting: Family Medicine
